# Patient Record
Sex: Female | Born: 1985 | Race: Black or African American | Hispanic: No | Marital: Single | Smoking: Current every day smoker
Health system: Southern US, Community
[De-identification: ages and names within clinical notes are randomized; demographics above are authoritative.]

## PROBLEM LIST (undated history)

## (undated) DIAGNOSIS — K219 Gastro-esophageal reflux disease without esophagitis: Secondary | ICD-10-CM

## (undated) DIAGNOSIS — I10 Essential (primary) hypertension: Secondary | ICD-10-CM

## (undated) DIAGNOSIS — K589 Irritable bowel syndrome without diarrhea: Secondary | ICD-10-CM

## (undated) HISTORY — PX: NO PAST SURGERIES: SHX2092

---
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
## 2006-07-25 ENCOUNTER — Inpatient Hospital Stay (HOSPITAL_COMMUNITY): Admission: AD | Admit: 2006-07-25 | Discharge: 2006-07-25 | Payer: Self-pay | Admitting: Obstetrics & Gynecology

## 2007-02-18 ENCOUNTER — Emergency Department (HOSPITAL_COMMUNITY): Admission: EM | Admit: 2007-02-18 | Discharge: 2007-02-18 | Payer: Self-pay | Admitting: Emergency Medicine

## 2007-03-26 ENCOUNTER — Emergency Department (HOSPITAL_COMMUNITY): Admission: EM | Admit: 2007-03-26 | Discharge: 2007-03-26 | Payer: Self-pay | Admitting: Emergency Medicine

## 2007-07-12 ENCOUNTER — Emergency Department (HOSPITAL_COMMUNITY): Admission: EM | Admit: 2007-07-12 | Discharge: 2007-07-12 | Payer: Self-pay | Admitting: Emergency Medicine

## 2007-12-12 ENCOUNTER — Emergency Department (HOSPITAL_COMMUNITY): Admission: EM | Admit: 2007-12-12 | Discharge: 2007-12-12 | Payer: Self-pay | Admitting: Emergency Medicine

## 2007-12-13 ENCOUNTER — Emergency Department (HOSPITAL_COMMUNITY): Admission: EM | Admit: 2007-12-13 | Discharge: 2007-12-13 | Payer: Self-pay | Admitting: Emergency Medicine

## 2008-03-11 ENCOUNTER — Emergency Department (HOSPITAL_COMMUNITY): Admission: EM | Admit: 2008-03-11 | Discharge: 2008-03-11 | Payer: Self-pay | Admitting: Emergency Medicine

## 2009-01-03 ENCOUNTER — Emergency Department (HOSPITAL_COMMUNITY): Admission: EM | Admit: 2009-01-03 | Discharge: 2009-01-04 | Payer: Self-pay | Admitting: Emergency Medicine

## 2009-12-22 ENCOUNTER — Emergency Department (HOSPITAL_COMMUNITY): Admission: EM | Admit: 2009-12-22 | Discharge: 2009-12-22 | Payer: Self-pay | Admitting: Emergency Medicine

## 2010-12-03 LAB — URINALYSIS, ROUTINE W REFLEX MICROSCOPIC
Glucose, UA: NEGATIVE mg/dL
Nitrite: NEGATIVE
Specific Gravity, Urine: 1.017 (ref 1.005–1.030)
pH: 6 (ref 5.0–8.0)

## 2010-12-03 LAB — PREGNANCY, URINE: Preg Test, Ur: NEGATIVE

## 2010-12-24 LAB — CBC
MCHC: 34.4 g/dL (ref 30.0–36.0)
MCV: 92.2 fL (ref 78.0–100.0)
Platelets: 232 10*3/uL (ref 150–400)
RBC: 4.35 MIL/uL (ref 3.87–5.11)
WBC: 7.3 10*3/uL (ref 4.0–10.5)

## 2010-12-24 LAB — DIFFERENTIAL
Eosinophils Absolute: 0.2 10*3/uL (ref 0.0–0.7)
Eosinophils Relative: 2 % (ref 0–5)
Lymphocytes Relative: 52 % — ABNORMAL HIGH (ref 12–46)
Lymphs Abs: 3.8 10*3/uL (ref 0.7–4.0)
Monocytes Absolute: 0.7 10*3/uL (ref 0.1–1.0)
Monocytes Relative: 10 % (ref 3–12)

## 2010-12-24 LAB — COMPREHENSIVE METABOLIC PANEL
ALT: 43 U/L — ABNORMAL HIGH (ref 0–35)
AST: 34 U/L (ref 0–37)
Albumin: 3.6 g/dL (ref 3.5–5.2)
CO2: 27 mEq/L (ref 19–32)
Calcium: 9 mg/dL (ref 8.4–10.5)
Chloride: 104 mEq/L (ref 96–112)
Creatinine, Ser: 0.57 mg/dL (ref 0.4–1.2)
GFR calc Af Amer: >60 mL/min (ref >60)
GFR calc non Af Amer: >60 mL/min (ref >60)
Sodium: 138 mEq/L (ref 135–145)

## 2010-12-24 LAB — URINALYSIS, ROUTINE W REFLEX MICROSCOPIC
Nitrite: NEGATIVE
Protein, ur: NEGATIVE mg/dL
Specific Gravity, Urine: 1.009 (ref 1.005–1.030)
Urobilinogen, UA: 0.2 mg/dL (ref 0.0–1.0)

## 2010-12-24 LAB — LIPASE, BLOOD: Lipase: 20 U/L (ref 11–59)
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
## 2011-01-30 NOTE — H&P (Signed)
[facility]  Patient:    Amy Bass, Amy Bass                        MRN: [id number] Adm. Date:  05/03/00 Attending:  Jasmine Pang, M.D.                   Psychiatric Admission Assessment  PATIENT IDENTIFICATION:  This is a 25 year old African-American female referred to our unit after several days of treatment in [facility] for an overdose on Tylenol and Benadryl.  HISTORY OF PRESENT ILLNESS:  Patient has had a several-month history of becoming increasingly depressed with insomnia, psychomotor retardation, anhedonia, anergia and difficulty concentrating.  Her mother reports a decrease in her grades from the A[employer]B honor roll during the last part of the school year.  Patient admits to feeling hopeless and her low self-esteem with persistent suicidal ideation.  She overdosed on 40 Tylenol and a large number of Benadryl in an attempt to kill herself.  A major stressor revolves around her being unable to see her best friend of five years anymore.  The best friends family has converted to the [place] witness faith and will no allow her to see anyone who is not of that faith.  PAST PSYCHIATRIC HISTORY:  No previous treatment.  SUBSTANCE ABUSE HISTORY:  Denies.  PAST MEDICAL HISTORY:  Status post recent treatment for ingestion of Tylenol and Benadryl in an overdose attempt.  She ingested approximately 14-35 g of Tylenol and 1000 mg of Benadryl.  Acetaminophen level was 168 ng/ml which is in the toxic range.  She was treated with Mucomyst.  There were no elevation of LFTs.  ALLERGIES:  No known drug allergies.  MEDICATIONS:  Patient takes birth control pills daily.  She is sexually active.  SOCIAL HISTORY:  Patient lives with her mother, father, brother and sister. She is beginning ninth grade this year.  She likes school, though states her grades began to decrease at the end of last year as she got depressed.  There is no history of  physical or sexual abuse.  FAMILY PSYCHIATRIC HISTORY:  Positive for some history of chemical dependence. The rest is unknown at this time.  LEGAL HISTORY:  None.  MENTAL STATUS EXAMINATION:  On admission, patient presented as a reserved, tearful but cooperative African-American female.  She had poor eye contact and was disheveled.  There was psychomotor retardation.  Speech was soft and slow. There was no articulation disorder.  Mood was depressed.  Affect sad and tearful.  There was positive suicidal ideation as per history of present illness thought she states she is not currently suicidal.  There was no homicidal ideation.  There was no self-injurious behavior or aggression. There was no psychosis or perceptual disturbance.  Thought processes are logical and goal directed.  Thought content revealed no predominant theme. Patient was alert and oriented x 4.  Short-term and long-term memory were adequate.  General fund of knowledge were age and education level appropriate. The attention and concentration were marked by distractibility and difficulty focusing.  Insight moderate.  Judgment poor.  ADMISSION DIAGNOSES: Axis I:    Major depression, single episode, severe without psychosis. Axis II:   Deferred. Axis III:  Status post overdose on Tylenol and Benadryl with medical            stabilization. Axis IV:   Severe. Axis V:    GAF 20.  ASSETS AND STRENGTHS:  Patient is  healthy.  Patient has a supportive family. she does well academically.  She is friendly and engaging and verbal.  PROBLEMS:  Mood instability with suicidal ideation.  SHORT-TERM TREATMENT GOAL:  Resolution of suicidal ideation.  LONG-TERM TREATMENT GOAL:  Resolution of mood instability.  INITIAL PLAN OF CARE:  Begin Celexa for depression.  Will begin unit therapeutic groups and activities and family therapy.  ESTIMATED LENGTH OF STAY:  Five to seven days.  CONDITIONS NECESSARY FOR DISCHARGE:  Not  suicidal.  POST HOSPITAL CARE PLAN:  Return home to live with family.  Follow-up therapy and medication management will be at the [facility]. DD:  05/04/00 TD:  05/05/00 Job: [id number]

## 2011-02-19 ENCOUNTER — Emergency Department (HOSPITAL_COMMUNITY)
Admission: EM | Admit: 2011-02-19 | Discharge: 2011-02-19 | Disposition: A | Discharge status: Home / Self Care | Payer: Self-pay | Attending: Emergency Medicine | Admitting: Emergency Medicine

## 2011-02-19 ENCOUNTER — Emergency Department (HOSPITAL_COMMUNITY)
Admission: EM | Admit: 2011-02-19 | Discharge: 2011-02-19 | Discharge status: Against Medical Advice | Payer: Self-pay | Attending: Emergency Medicine | Admitting: Emergency Medicine

## 2011-02-19 DIAGNOSIS — L0231 Cutaneous abscess of buttock: Secondary | ICD-10-CM | POA: Insufficient documentation

## 2011-02-19 DIAGNOSIS — F17200 Nicotine dependence, unspecified, uncomplicated: Secondary | ICD-10-CM | POA: Insufficient documentation

## 2011-02-19 DIAGNOSIS — L03319 Cellulitis of trunk, unspecified: Secondary | ICD-10-CM | POA: Insufficient documentation

## 2011-02-19 DIAGNOSIS — L02219 Cutaneous abscess of trunk, unspecified: Secondary | ICD-10-CM | POA: Insufficient documentation

## 2011-09-18 ENCOUNTER — Encounter: Payer: Self-pay | Admitting: *Deleted

## 2011-09-18 ENCOUNTER — Emergency Department (HOSPITAL_COMMUNITY)
Admission: EM | Admit: 2011-09-18 | Discharge: 2011-09-18 | Disposition: A | Discharge status: Home / Self Care | Payer: Self-pay | Attending: Emergency Medicine | Admitting: Emergency Medicine

## 2011-09-18 DIAGNOSIS — Z79899 Other long term (current) drug therapy: Secondary | ICD-10-CM | POA: Insufficient documentation

## 2011-09-18 DIAGNOSIS — I10 Essential (primary) hypertension: Secondary | ICD-10-CM | POA: Insufficient documentation

## 2011-09-18 DIAGNOSIS — R220 Localized swelling, mass and lump, head: Secondary | ICD-10-CM | POA: Insufficient documentation

## 2011-09-18 DIAGNOSIS — F17200 Nicotine dependence, unspecified, uncomplicated: Secondary | ICD-10-CM | POA: Insufficient documentation

## 2011-09-18 DIAGNOSIS — T7840XA Allergy, unspecified, initial encounter: Secondary | ICD-10-CM | POA: Insufficient documentation

## 2011-09-18 DIAGNOSIS — R21 Rash and other nonspecific skin eruption: Secondary | ICD-10-CM | POA: Insufficient documentation

## 2011-09-18 HISTORY — DX: Essential (primary) hypertension: I10

## 2011-09-18 MED ORDER — DIPHENHYDRAMINE HCL 25 MG PO TABS: 25.0000 mg | ORAL_TABLET | Freq: Four times a day (QID) | ORAL | Status: AC

## 2011-09-18 MED ORDER — DOXYCYCLINE HYCLATE 100 MG PO CAPS: 100.0000 mg | ORAL_CAPSULE | Freq: Two times a day (BID) | ORAL | Status: AC

## 2011-09-18 MED ORDER — DOXYCYCLINE HYCLATE 100 MG PO TABS
100.0000 mg | ORAL_TABLET | Freq: Once | ORAL | Status: AC
  Administered 2011-09-18: 100 mg via ORAL
  Filled 2011-09-18: qty 1

## 2011-09-18 MED ORDER — DIPHENHYDRAMINE HCL 25 MG PO CAPS
50.0000 mg | ORAL_CAPSULE | Freq: Once | ORAL | Status: AC
  Administered 2011-09-18: 50 mg via ORAL
  Filled 2011-09-18: qty 1

## 2011-09-18 NOTE — ED Provider Notes (Signed)
History     CSN: [id number]  Arrival date & time 09/18/11  1256   First MD Initiated Contact with Patient 09/18/11 1425      Chief Complaint  Patient presents with  . Facial Swelling    pt reports "I think something bit me" pt has swelling to left side of face and eye. pt reports pain around lips and cheek.     (Consider location/radiation/quality/duration/timing/severity/associated sxs/prior treatment) HPI  Patient presents to emergency department complaining of acute onset left-sided facial swelling when she woke this morning. Patient states that she has a history of having some mild skin itching/mild allergic reaction if she changes detergents or sleeps in bedding it and washed in different detergents and states that last night she did sleep with a different pillow and at someone else's house. Patient states when she woke this morning she has swelling around her left eye and on her left cheek that she initially attributed to another allergic reaction. patient states the swelling has actually improved throughout the day however as the swelling in her cheek went down she noticed four small red bumps on her cheek. At that time patient states she was concerned that something may have "a bit me in my sleep." Patient states the swelling is improving and denies any throat swelling or difficulty breathing or swallowing. Patient denies any visual changes or drainage from her eye. Patient states she doesn't have any known specific allergies. Patient takes medicine for hypertension by her PCP but no other daily medicines.  Past Medical History  Diagnosis Date  . Hypertension     History reviewed. No pertinent past surgical history.  History reviewed. No pertinent family history.  History  Substance Use Topics  . Smoking status: Current Everyday Smoker    Types: Cigarettes  . Smokeless tobacco: Not on file  . Alcohol Use: Yes    OB History    Grav Para Term Preterm Abortions TAB SAB Ect  Mult Living                  Review of Systems  All other systems reviewed and are negative.    Allergies  Review of patient's allergies indicates no known allergies.  Home Medications  No current outpatient prescriptions on file.  BP 131/88  Pulse 96  Temp(Src) 98.8 F (37.1 C) (Oral)  Resp 18  Wt 160 lb (72.576 kg)  SpO2 100%  Physical Exam  Nursing note and vitals reviewed. Constitutional: She is oriented to person, place, and time. She appears well-developed and well-nourished. No distress.  HENT:  Head: Normocephalic.       Left. Orbital edema but no erythema or drainage from eye. No pain with tenderness to palpation. Soft tissue swelling of left cheek. No crepitus or erythema.  Eyes: Conjunctivae and EOM are normal. Pupils are equal, round, and reactive to light. Right eye exhibits no discharge. Left eye exhibits no discharge.       EOMI without pain. Airway patent. The uvula edema.  Neck: Normal range of motion. Neck supple.  Cardiovascular: Normal rate, regular rhythm, normal heart sounds and intact distal pulses.  Exam reveals no gallop and no friction rub.   No murmur heard. Pulmonary/Chest: Effort normal and breath sounds normal. No respiratory distress. She has no wheezes. She has no rales. She exhibits no tenderness.  Abdominal: Bowel sounds are normal. She exhibits no distension and no mass. There is no tenderness. There is no rebound and no guarding.  Musculoskeletal: Normal range  of motion. She exhibits no edema and no tenderness.  Lymphadenopathy:    She has no cervical adenopathy.  Neurological: She is alert and oriented to person, place, and time.  Skin: Skin is warm and dry. Rash noted. She is not diaphoretic. No erythema.       Or small faintly erythematous papules in and "C." pattern of left cheek without fluctuance or induration.  Psychiatric: She has a normal mood and affect.    ED Course  Procedures (including critical care time)  By mouth  prednisone and doxycycline  Labs Reviewed - No data to display No results found.   1. Allergic reaction       MDM  Question allergic reaction versus insect bite. Patient has no signs or symptoms of anaphylaxis. The signs or symptoms of orbital or septal cellulitis. She is afebrile and in no acute distress. Patient states swelling has improved throughout the day without any intervention. We will prescribe her dose any history for allergic reaction and doxycycline to cover for infection. Patient is agreeable to following up with her primary care physician on Monday for recheck however we spoke in length about changing or worsening of symptoms it should warrent return to emergency department immediately for further evaluation. Patient voices her understanding and is agreeable to plan.        Jenness [place], [place] 09/18/11 1458

## 2011-09-19 ENCOUNTER — Encounter (HOSPITAL_COMMUNITY): Payer: Self-pay

## 2011-09-19 ENCOUNTER — Emergency Department (HOSPITAL_COMMUNITY)
Admission: EM | Admit: 2011-09-19 | Discharge: 2011-09-19 | Disposition: A | Discharge status: Home / Self Care | Payer: Self-pay | Attending: Emergency Medicine | Admitting: Emergency Medicine

## 2011-09-19 DIAGNOSIS — F17200 Nicotine dependence, unspecified, uncomplicated: Secondary | ICD-10-CM | POA: Insufficient documentation

## 2011-09-19 DIAGNOSIS — I10 Essential (primary) hypertension: Secondary | ICD-10-CM | POA: Insufficient documentation

## 2011-09-19 DIAGNOSIS — R21 Rash and other nonspecific skin eruption: Secondary | ICD-10-CM | POA: Insufficient documentation

## 2011-09-19 DIAGNOSIS — H538 Other visual disturbances: Secondary | ICD-10-CM | POA: Insufficient documentation

## 2011-09-19 DIAGNOSIS — H5789 Other specified disorders of eye and adnexa: Secondary | ICD-10-CM | POA: Insufficient documentation

## 2011-09-19 MED ORDER — DIPHENHYDRAMINE HCL 25 MG PO CAPS
50.0000 mg | ORAL_CAPSULE | Freq: Once | ORAL | Status: AC
  Administered 2011-09-19: 50 mg via ORAL
  Filled 2011-09-19 (×2): qty 1

## 2011-09-19 MED ORDER — TETRACAINE HCL 0.5 % OP SOLN
1.0000 [drp] | Freq: Once | OPHTHALMIC | Status: AC
  Administered 2011-09-19: 1 [drp] via OPHTHALMIC
  Filled 2011-09-19: qty 2

## 2011-09-19 NOTE — ED Provider Notes (Signed)
History     CSN: [id number]  Arrival date & time 09/19/11  1323   First MD Initiated Contact with Patient 09/19/11 1533      No chief complaint on file.   (Consider location/radiation/quality/duration/timing/severity/associated sxs/prior treatment) HPI Comments: Patient presents with chief complaint of left eye swelling and rash on face.  Patient was evaluated for the same complaint yesterday and was discharged with doxycycline and Benadryl.  Patient denies double vision, vision loss or pain with extraocular movements, rhinorrhea, fever or, night sweats, chills, eye redness or vesicle type rash. Pt denies difficulty breathing, shortness of breath, or the feeling that her throat is closing  Patient states she has no voice change and she is able to swallow her secretions.    The history is provided by the patient.    Past Medical History  Diagnosis Date  . Hypertension     History reviewed. No pertinent past surgical history.  No family history on file.  History  Substance Use Topics  . Smoking status: Current Everyday Smoker    Types: Cigarettes  . Smokeless tobacco: Not on file  . Alcohol Use: Yes    OB History    Grav Para Term Preterm Abortions TAB SAB Ect Mult Living                  Review of Systems  Constitutional: Negative for fever (temp 99 at ED arrival ), chills and appetite change.  HENT: Negative for hearing loss, ear pain, congestion, facial swelling, rhinorrhea, trouble swallowing, neck pain, neck stiffness and voice change.   Eyes: Positive for discharge. Negative for photophobia, pain, redness, itching and visual disturbance (slight blurred vision of left eye).  Respiratory: Negative for choking, shortness of breath, wheezing and stridor.   Cardiovascular: Negative for chest pain, palpitations and leg swelling.  Gastrointestinal: Negative for abdominal pain.  Genitourinary: Negative for dysuria, urgency and frequency.  Skin: Positive for rash (on left  cheak, right forhead, right hand, and mid back).  Neurological: Negative for dizziness, syncope, weakness, light-headedness, numbness and headaches.  Psychiatric/Behavioral: Negative for confusion.    Allergies  Review of patient's allergies indicates no known allergies.  Home Medications   Current Outpatient Rx  Name Route Sig Dispense Refill  . DIPHENHYDRAMINE HCL 25 MG PO TABS Oral Take 1 tablet (25 mg total) by mouth every 6 (six) hours. 20 tablet 0  . DOXYCYCLINE HYCLATE 100 MG PO CAPS Oral Take 1 capsule (100 mg total) by mouth 2 (two) times daily. 14 capsule 0    BP 118/75  Pulse 100  Temp 99 F (37.2 C)  Resp 18  SpO2 100%  Physical Exam  Constitutional: She is oriented to person, place, and time. She appears well-developed and well-nourished. No distress.  HENT:  Head: Normocephalic and atraumatic.    Right Ear: Hearing and tympanic membrane normal.  Left Ear: Hearing and tympanic membrane normal.  Mouth/Throat: Oropharynx is clear and moist. No oropharyngeal exudate.       Facial nodule with rash in shape of C around the mouth.   Eyes: Conjunctivae and EOM are normal. Pupils are equal, round, and reactive to light. Right conjunctiva is not injected. No scleral icterus.         No visible discharge.  Swelling and erythema surrounding left eye.  EOM intact no nystagmus.  No tenderness to palpation.  Neck: Normal range of motion. Neck supple. No tracheal deviation present. No thyromegaly present.  Cardiovascular: Normal rate, regular rhythm, normal  heart sounds and intact distal pulses.   Pulmonary/Chest: Effort normal and breath sounds normal. No stridor. No respiratory distress.  Musculoskeletal: Normal range of motion. She exhibits no edema and no tenderness.  Neurological: She is alert and oriented to person, place, and time. She has normal reflexes. Coordination normal.  Skin: Skin is warm, dry and intact. Rash noted. No petechiae and no purpura noted. Rash is  papular and nodular. Rash is not pustular, not vesicular and not urticarial. She is not diaphoretic. No erythema. No pallor.       Rash does not follow a dermatomal pattern.  Psychiatric: She has a normal mood and affect. Her behavior is normal.    ED Course  Procedures (including critical care time)  Woods lamp performed: No corneal abrasions or fluorescein uptake.  Labs Reviewed - No data to display No results found.   No diagnosis found.    MDM  Peri orbital cellulitis vs insect bite vs allergic rxn  Pts IOPs are WNL, she has no pain or entrapment w EOMs, and she denies loss or double vision. Pt will be advised to continue to take doxycycline as prescribed & f-u w her PCP Monday AM. Pt has also been advised to f-u with opthalmology if blurred vision does not resolve by Monday. Pt advised to return to ED if pain w EOM, double vision, or vision loss occur.         [place], [place] 09/19/11 1727

## 2011-09-19 NOTE — ED Notes (Signed)
Patient here for further evaluation of facial swelling with raised red areas, took meds w/o relief..itching to same

## 2011-09-19 NOTE — ED Provider Notes (Signed)
Medical screening examination/treatment/procedure(s) were performed by non-physician practitioner and as supervising physician I was immediately available for consultation/collaboration.   Chibuikem Thang L Chanci Ojala, MD 09/19/11 0706 

## 2011-09-20 NOTE — ED Provider Notes (Signed)
Medical screening examination/treatment/procedure(s) were performed by non-physician practitioner and as supervising physician I was immediately available for consultation/collaboration.  Amarylis Rovito P Aquinnah Devin, MD 09/20/11 0020 

## 2016-01-15 ENCOUNTER — Encounter (HOSPITAL_COMMUNITY): Payer: Self-pay | Admitting: Emergency Medicine

## 2016-01-15 ENCOUNTER — Emergency Department (HOSPITAL_COMMUNITY)
Admission: EM | Admit: 2016-01-15 | Discharge: 2016-01-15 | Disposition: A | Discharge status: Home / Self Care | Payer: Self-pay | Attending: Emergency Medicine | Admitting: Emergency Medicine

## 2016-01-15 DIAGNOSIS — L0291 Cutaneous abscess, unspecified: Secondary | ICD-10-CM

## 2016-01-15 DIAGNOSIS — I10 Essential (primary) hypertension: Secondary | ICD-10-CM | POA: Insufficient documentation

## 2016-01-15 DIAGNOSIS — Z791 Long term (current) use of non-steroidal anti-inflammatories (NSAID): Secondary | ICD-10-CM | POA: Insufficient documentation

## 2016-01-15 DIAGNOSIS — F17210 Nicotine dependence, cigarettes, uncomplicated: Secondary | ICD-10-CM | POA: Insufficient documentation

## 2016-01-15 DIAGNOSIS — L0390 Cellulitis, unspecified: Secondary | ICD-10-CM

## 2016-01-15 DIAGNOSIS — Z79891 Long term (current) use of opiate analgesic: Secondary | ICD-10-CM | POA: Insufficient documentation

## 2016-01-15 DIAGNOSIS — L02214 Cutaneous abscess of groin: Secondary | ICD-10-CM | POA: Insufficient documentation

## 2016-01-15 DIAGNOSIS — Z792 Long term (current) use of antibiotics: Secondary | ICD-10-CM | POA: Insufficient documentation

## 2016-01-15 MED ORDER — LIDOCAINE-EPINEPHRINE (PF) 2 %-1:200000 IJ SOLN: 10.0000 mL | Freq: Once | INTRAMUSCULAR | Status: DC

## 2016-01-15 MED ORDER — ACETAMINOPHEN 325 MG PO TABS
650.0000 mg | ORAL_TABLET | Freq: Once | ORAL | Status: AC
  Administered 2016-01-15: 650 mg via ORAL
  Filled 2016-01-15: qty 2

## 2016-01-15 MED ORDER — LIDOCAINE-EPINEPHRINE (PF) 2 %-1:200000 IJ SOLN
20.0000 mL | Freq: Once | INTRAMUSCULAR | Status: AC
  Administered 2016-01-15: 20 mL via INTRADERMAL

## 2016-01-15 MED ORDER — HYDROCODONE-ACETAMINOPHEN 5-325 MG PO TABS: 1.0000 | ORAL_TABLET | ORAL | Status: DC | PRN

## 2016-01-15 MED ORDER — LIDOCAINE-EPINEPHRINE (PF) 2 %-1:200000 IJ SOLN
INTRAMUSCULAR | Status: AC
  Filled 2016-01-15: qty 20

## 2016-01-15 MED ORDER — IBUPROFEN 800 MG PO TABS: 800.0000 mg | ORAL_TABLET | Freq: Three times a day (TID) | ORAL | Status: DC

## 2016-01-15 MED ORDER — LIDOCAINE-EPINEPHRINE 2 %-1:100000 IJ SOLN: 20.0000 mL | Freq: Once | INTRAMUSCULAR | Status: DC

## 2016-01-15 MED ORDER — CEPHALEXIN 500 MG PO CAPS: 500.0000 mg | ORAL_CAPSULE | Freq: Four times a day (QID) | ORAL | Status: DC

## 2016-01-15 MED ORDER — SULFAMETHOXAZOLE-TRIMETHOPRIM 800-160 MG PO TABS: 1.0000 | ORAL_TABLET | Freq: Two times a day (BID) | ORAL | Status: DC

## 2016-01-15 MED ORDER — CEPHALEXIN 500 MG PO CAPS: 500.0000 mg | ORAL_CAPSULE | Freq: Four times a day (QID) | ORAL | Status: AC

## 2016-01-15 MED ORDER — SULFAMETHOXAZOLE-TRIMETHOPRIM 800-160 MG PO TABS: 1.0000 | ORAL_TABLET | Freq: Two times a day (BID) | ORAL | Status: AC

## 2016-01-15 MED ORDER — IBUPROFEN 800 MG PO TABS
800.0000 mg | ORAL_TABLET | Freq: Once | ORAL | Status: AC
  Administered 2016-01-15: 800 mg via ORAL
  Filled 2016-01-15: qty 1

## 2016-01-15 NOTE — ED Provider Notes (Signed)
CSN: [id number]     Arrival date & time 01/15/16  4098 History   First MD Initiated Contact with Patient 01/15/16 1103     Chief Complaint  Patient presents with  . Abscess     (Consider location/radiation/quality/duration/timing/severity/associated sxs/prior Treatment) HPI Amy Bass is a 30 y.o. female with PMH significant for HTN who presents with 4 day history of gradual onset, constant, worsening, painful area on her left upper pubic area.  She has attempted warm compresses without relief.  No medications PTA.  No drainage.  No hx of abscess.  Denies other medical problems or immunocompromise.  Past Medical History  Diagnosis Date  . Hypertension    History reviewed. No pertinent past surgical history. No family history on file. Social History  Substance Use Topics  . Smoking status: Current Every Day Smoker    Types: Cigarettes  . Smokeless tobacco: None  . Alcohol Use: Yes   OB History    No data available     Review of Systems All other systems negative unless otherwise stated in HPI    Allergies  Review of patient's allergies indicates no known allergies.  Home Medications   Prior to Admission medications   Medication Sig Start Date End Date Taking? Authorizing Provider  MedroxyPROGESTERone Acetate 150 MG/ML SUSY every 3 (three) months.  12/17/15  Yes Historical Provider, MD  cephALEXin (KEFLEX) 500 MG capsule Take 1 capsule (500 mg total) by mouth 4 (four) times daily. 01/15/16   Cheri Fowler, PA-C  HYDROcodone-acetaminophen (NORCO/VICODIN) 5-325 MG tablet Take 1 tablet by mouth every 4 (four) hours as needed. 01/15/16   Cheri Fowler, PA-C  ibuprofen (ADVIL,MOTRIN) 800 MG tablet Take 1 tablet (800 mg total) by mouth 3 (three) times daily. 01/15/16   Cheri Fowler, PA-C  sulfamethoxazole-trimethoprim (BACTRIM DS,SEPTRA DS) 800-160 MG tablet Take 1 tablet by mouth 2 (two) times daily. 01/15/16 01/22/16  Selig Wampole, PA-C   BP 159/88 mmHg  Pulse 80  Temp(Src) 97.5 F (36.4  C) (Oral)  Resp 17  Ht  (1.626 m)  Wt 89.812 kg  BMI 33.97 kg/m2  SpO2 100% Physical Exam  Constitutional: She is oriented to person, place, and time. She appears well-developed and well-nourished.  HENT:  Head: Normocephalic and atraumatic.  Right Ear: External ear normal.  Left Ear: External ear normal.  Eyes: Conjunctivae are normal. No scleral icterus.  Neck: No tracheal deviation present.  Cardiovascular: Normal rate, regular rhythm and normal heart sounds.   Pulmonary/Chest: Effort normal and breath sounds normal. No respiratory distress. She has no wheezes. She has no rales.  Abdominal: Soft. Bowel sounds are normal. She exhibits no distension. There is no tenderness. There is no rebound.  Musculoskeletal: Normal range of motion.  Neurological: She is alert and oriented to person, place, and time.  Skin: Skin is warm and dry.     Psychiatric: She has a normal mood and affect. Her behavior is normal.    ED Course  Procedures (including critical care time)  INCISION AND DRAINAGE Performed by: Cheri Fowler Consent: Verbal consent obtained. Risks and benefits: risks, benefits and alternatives were discussed Type: abscess  Body area: left upper pubic region  Anesthesia: local infiltration  Incision was made with a scalpel.  Local anesthetic: xylocaine w/ 2% epinephrine  Anesthetic total: 5 ml  Complexity: complex Blunt dissection to break up loculations  Drainage: purulent  Drainage amount: moderate  Packing material: none  Patient tolerance: Patient tolerated the procedure well with no immediate  complications.    Labs Review Labs Reviewed - No data to display  Imaging Review No results found. I have personally reviewed and evaluated these images and lab results as part of my medical decision-making.   EKG Interpretation None      MDM   Final diagnoses:  Abscess and cellulitis   Patient presents with findings consistent with abscess with  surrounding cellulitis.  No systemic symptoms.  VSS, NAD.  Afebrile.  Abscess with cellulitis in left upper mons pubis.  I&D performed with moderate purulent drainage.  Plan to discharge home with Bactrim and Keflex.  Norco and Ibuprofen for pain.  Follow up with PCP or this department in 2 days for wound check.  Discussed return precautions.  Patient agrees and acknowledges the above plan for discharge.    Cheri FowlerKayla Shereece Wellborn, PA-C 01/15/16 1239  Lorre NickAnthony Allen, MD 01/17/16 [phone]

## 2016-01-15 NOTE — ED Notes (Signed)
EDPA MADE AWARE OF DELAY OF BACK ORDER ON LIDO MEDICATION PER PHARMACY. SPOKE WITH PHARMACY. REORDER FOR MEDICATION

## 2016-01-15 NOTE — Discharge Instructions (Signed)
Your abscess was drained today. There is also surrounding cellulitis, or infection. Please take Bactrim twice daily for the next 7 days. Please take Keflex 4 times daily for the next 7 days as well. You may  take Norco every 4 hours as needed for pain. Please also take ibuprofen 3 times daily for pain. Please follow-up with your primary care doctor or this department in 2 days for wound check. Return to the emergency department if you experience fever, chills, nausea, vomiting, or increased redness and pain to the area.  Abscess An abscess is an infected area that contains a collection of pus and debris.It can occur in almost any part of the body. An abscess is also known as a furuncle or boil. CAUSES  An abscess occurs when tissue gets infected. This can occur from blockage of oil or sweat glands, infection of hair follicles, or a minor injury to the skin. As the body tries to fight the infection, pus collects in the area and creates pressure under the skin. This pressure causes pain. People with weakened immune systems have difficulty fighting infections and get certain abscesses more often.  SYMPTOMS Usually an abscess develops on the skin and becomes a painful mass that is red, warm, and tender. If the abscess forms under the skin, you may feel a moveable soft area under the skin. Some abscesses break open (rupture) on their own, but most will continue to get worse without care. The infection can spread deeper into the body and eventually into the bloodstream, causing you to feel ill.  DIAGNOSIS  Your caregiver will take your medical history and perform a physical exam. A sample of fluid may also be taken from the abscess to determine what is causing your infection. TREATMENT  Your caregiver may prescribe antibiotic medicines to fight the infection. However, taking antibiotics alone usually does not cure an abscess. Your caregiver may need to make a small cut (incision) in the abscess to drain the  pus. In some cases, gauze is packed into the abscess to reduce pain and to continue draining the area. HOME CARE INSTRUCTIONS   Only take over-the-counter or prescription medicines for pain, discomfort, or fever as directed by your caregiver.  If you were prescribed antibiotics, take them as directed. Finish them even if you start to feel better.  If gauze is used, follow your caregiver's directions for changing the gauze.  To avoid spreading the infection:  Keep your draining abscess covered with a bandage.  Wash your hands well.  Do not share personal care items, towels, or whirlpools with others.  Avoid skin contact with others.  Keep your skin and clothes clean around the abscess.  Keep all follow-up appointments as directed by your caregiver. SEEK MEDICAL CARE IF:   You have increased pain, swelling, redness, fluid drainage, or bleeding.  You have muscle aches, chills, or a general ill feeling.  You have a fever. MAKE SURE YOU:   Understand these instructions.  Will watch your condition.  Will get help right away if you are not doing well or get worse.   This information is not intended to replace advice given to you by your health care provider. Make sure you discuss any questions you have with your health care provider.   Document Released: 06/10/2005 Document Revised: 03/01/2012 Document Reviewed: 11/13/2011 Elsevier Interactive Patient Education 2016 Elsevier Inc.  Incision and Drainage Incision and drainage is a procedure in which a sac-like structure (cystic structure) is opened and drained. The  area to be drained usually contains material such as pus, fluid, or blood.  LET YOUR CAREGIVER KNOW ABOUT:   Allergies to medicine.  Medicines taken, including vitamins, herbs, eyedrops, over-the-counter medicines, and creams.  Use of steroids (by mouth or creams).  Previous problems with anesthetics or numbing medicines.  History of bleeding problems or blood  clots.  Previous surgery.  Other health problems, including diabetes and kidney problems.  Possibility of pregnancy, if this applies. RISKS AND COMPLICATIONS  Pain.  Bleeding.  Scarring.  Infection. BEFORE THE PROCEDURE  You may need to have an ultrasound or other imaging tests to see how large or deep your cystic structure is. Blood tests may also be used to determine if you have an infection or how severe the infection is. You may need to have a tetanus shot. PROCEDURE  The affected area is cleaned with a cleaning fluid. The cyst area will then be numbed with a medicine (local anesthetic). A small incision will be made in the cystic structure. A syringe or catheter may be used to drain the contents of the cystic structure, or the contents may be squeezed out. The area will then be flushed with a cleansing solution. After cleansing the area, it is often gently packed with a gauze or another wound dressing. Once it is packed, it will be covered with gauze and tape or some other type of wound dressing. AFTER THE PROCEDURE   Often, you will be allowed to go home right after the procedure.  You may be given antibiotic medicine to prevent or heal an infection.  If the area was packed with gauze or some other wound dressing, you will likely need to come back in 1 to 2 days to get it removed.  The area should heal in about 14 days.   This information is not intended to replace advice given to you by your health care provider. Make sure you discuss any questions you have with your health care provider.   Document Released: 02/24/2001 Document Revised: 03/01/2012 Document Reviewed: 10/26/2011 Elsevier Interactive Patient Education [employer] Elsevier Inc.

## 2016-01-15 NOTE — ED Notes (Signed)
EDPA AND LAUREN M PRESENT at bedside.

## 2016-01-15 NOTE — ED Notes (Signed)
TRAY AT BS

## 2016-01-15 NOTE — ED Notes (Signed)
Patient here from home with complaint of abscess to upper pubic area. Reports ingrown hair follicle that has gotten bigger over 3 days. No drainage.

## 2016-01-15 NOTE — Progress Notes (Signed)
Pt noted in EPIc without pcp nor insurance coverage ED registration completed in EPIC Registration staff states pt did not have her ID number ands was given a form to complete and return Pt states she has insurance [facility] [employer] - [employer] and [employer] shield  Pt [facility] CM noted pt with coverage but no pcp listed [facility] ED CM spoke with pt on how to obtain an in network pcp with insurance coverage via the customer service number or web site  Cm reviewed ED level of care for crisis/emergent services and community pcp level of care to manage continuous or chronic medical concerns.  The pt voiced understanding CM encouraged pt and discussed pt's responsibility to verify with pt's insurance carrier that any recommended medical provider offered by any emergency room or a hospital provider is within the carrier's network. The pt voiced understanding  Cm discussed pt checking [employer]BS for OB GYN & pcp in [place] for f/u care Pt left with uninsured resources also for medication resources on sheet  Pt informed Cm she would like to leave to get to her 3 pm class  ED Rn updated about pt having a 3 pm class to go to

## 2016-01-15 NOTE — ED Notes (Signed)
LIDO MEDICATION AT BEDSIDE

## 2016-04-10 ENCOUNTER — Other Ambulatory Visit: Payer: Self-pay | Admitting: Family

## 2016-04-10 DIAGNOSIS — R1012 Left upper quadrant pain: Secondary | ICD-10-CM

## 2016-04-17 ENCOUNTER — Other Ambulatory Visit: Payer: Self-pay

## 2018-01-08 ENCOUNTER — Other Ambulatory Visit: Payer: Self-pay

## 2018-01-08 ENCOUNTER — Emergency Department (HOSPITAL_COMMUNITY): Payer: Self-pay

## 2018-01-08 ENCOUNTER — Emergency Department (HOSPITAL_COMMUNITY)
Admission: EM | Admit: 2018-01-08 | Discharge: 2018-01-08 | Disposition: A | Discharge status: Home / Self Care | Payer: Self-pay | Attending: Emergency Medicine | Admitting: Emergency Medicine

## 2018-01-08 ENCOUNTER — Encounter (HOSPITAL_COMMUNITY): Payer: Self-pay

## 2018-01-08 DIAGNOSIS — Z720 Tobacco use: Secondary | ICD-10-CM

## 2018-01-08 DIAGNOSIS — F17290 Nicotine dependence, other tobacco product, uncomplicated: Secondary | ICD-10-CM | POA: Insufficient documentation

## 2018-01-08 DIAGNOSIS — I10 Essential (primary) hypertension: Secondary | ICD-10-CM | POA: Insufficient documentation

## 2018-01-08 DIAGNOSIS — J209 Acute bronchitis, unspecified: Secondary | ICD-10-CM | POA: Insufficient documentation

## 2018-01-08 LAB — GROUP A STREP BY PCR: Group A Strep by PCR: NOT DETECTED

## 2018-01-08 MED ORDER — ALBUTEROL SULFATE HFA 108 (90 BASE) MCG/ACT IN AERS
1.0000 | INHALATION_SPRAY | RESPIRATORY_TRACT | Status: DC | PRN
  Administered 2018-01-08: 1 via RESPIRATORY_TRACT
  Filled 2018-01-08: qty 6.7

## 2018-01-08 MED ORDER — IBUPROFEN 800 MG PO TABS
800.0000 mg | ORAL_TABLET | Freq: Once | ORAL | Status: AC
  Administered 2018-01-08: 800 mg via ORAL
  Filled 2018-01-08: qty 1

## 2018-01-08 MED ORDER — AZITHROMYCIN 250 MG PO TABS: 250.0000 mg | ORAL_TABLET | Freq: Every day | ORAL | 0 refills | Status: DC

## 2018-01-08 MED ORDER — AZITHROMYCIN 250 MG PO TABS
500.0000 mg | ORAL_TABLET | Freq: Once | ORAL | Status: AC
  Administered 2018-01-08: 500 mg via ORAL
  Filled 2018-01-08: qty 2

## 2018-01-08 MED ORDER — AEROCHAMBER PLUS FLO-VU MEDIUM MISC
1.0000 | Freq: Once | Status: AC
  Administered 2018-01-08: 1
  Filled 2018-01-08: qty 1

## 2018-01-08 MED ORDER — HYDROCODONE-ACETAMINOPHEN 5-325 MG PO TABS: 1.0000 | ORAL_TABLET | ORAL | 0 refills | Status: DC | PRN

## 2018-01-08 MED ORDER — IBUPROFEN 600 MG PO TABS: 600.0000 mg | ORAL_TABLET | Freq: Four times a day (QID) | ORAL | 0 refills | Status: AC | PRN

## 2018-01-08 NOTE — ED Triage Notes (Signed)
PT C/O A PRODUCTIVE COUGH WITH GREEN SPUTUM, FEVER, AND CHEST CONGESTION SINCE X 3 WEEKS. DENIES N/V.

## 2018-01-08 NOTE — Discharge Instructions (Signed)
Try to stop smoking. °

## 2018-01-08 NOTE — ED Provider Notes (Signed)
[facility]-EMERGENCY DEPT Provider Note   CSN: [id number] Arrival date & time: 01/08/18  8657     History   Chief Complaint Chief Complaint  Patient presents with  . Cough    HPI Amy Bass is a 32 y.o. female.  Pt presents to the ED today with cough and congestion for the past 3 weeks.  Sx started like allergies, but have progressed to coughing up green sputum.  Pt has had fevers.  She has taken otc nyquil without relief.  She does smoke.     Past Medical History:  Diagnosis Date  . Hypertension     There are no active problems to display for this patient.   History reviewed. No pertinent surgical history.   OB History   None      Home Medications    Prior to Admission medications   Medication Sig Start Date End Date Taking? Authorizing Provider  azithromycin (ZITHROMAX Z-PAK) 250 MG tablet Take 1 tablet (250 mg total) by mouth daily. 01/09/18   Jacalyn Lefevre, MD  cephALEXin (KEFLEX) 500 MG capsule Take 1 capsule (500 mg total) by mouth 4 (four) times daily. 01/15/16   Cheri Fowler, PA-C  HYDROcodone-acetaminophen (NORCO/VICODIN) 5-325 MG tablet Take 1 tablet by mouth every 4 (four) hours as needed. 01/08/18   Jacalyn Lefevre, MD  ibuprofen (ADVIL,MOTRIN) 600 MG tablet Take 1 tablet (600 mg total) by mouth every 6 (six) hours as needed. 01/08/18   Jacalyn Lefevre, MD  MedroxyPROGESTERone Acetate 150 MG/ML SUSY every 3 (three) months.  12/17/15   [provider]    Family History History reviewed. No pertinent family history.  Social History Social History   Tobacco Use  . Smoking status: Current Every Day Smoker    Packs/day: 0.25    Types: Cigars  . Smokeless tobacco: Never Used  Substance Use Topics  . Alcohol use: Yes    Comment: OCCASIONAL  . Drug use: No     Allergies   Patient has no known allergies.   Review of Systems Review of Systems  Constitutional: Positive for fever.  Respiratory: Positive for cough  and shortness of breath.   All other systems reviewed and are negative.    Physical Exam Updated Vital Signs BP (!) 157/106 (BP Location: Right Arm)   Pulse 84   Temp 98.4 F (36.9 C) (Oral)   Resp 18   Ht  (1.626 m)   Wt 104.3 kg (230 lb)   SpO2 97%   BMI 39.48 kg/m   Physical Exam  Constitutional: She is oriented to person, place, and time. She appears well-developed and well-nourished.  HENT:  Head: Normocephalic and atraumatic.  Right Ear: External ear normal.  Left Ear: External ear normal.  Nose: Nose normal.  Mouth/Throat: Oropharynx is clear and moist.  Eyes: Pupils are equal, round, and reactive to light. Conjunctivae and EOM are normal.  Neck: Normal range of motion. Neck supple.  Cardiovascular: Normal rate, regular rhythm, normal heart sounds and intact distal pulses.  Pulmonary/Chest: She has wheezes.  Abdominal: Soft. Bowel sounds are normal.  Musculoskeletal: Normal range of motion.  Neurological: She is alert and oriented to person, place, and time.  Skin: Skin is warm. Capillary refill takes less than 2 seconds.  Psychiatric: She has a normal mood and affect. Her behavior is normal. Judgment and thought content normal.  Nursing note and vitals reviewed.    ED Treatments / Results  Labs (all labs ordered are listed, but  only abnormal results are displayed) Labs Reviewed  GROUP A STREP BY PCR    EKG None  Radiology Dg Chest 2 View  Result Date: 01/08/2018 CLINICAL DATA:  Cough for the past 3 weeks. EXAM: CHEST - 2 VIEW COMPARISON:  Chest x-ray dated August 18, 2004. FINDINGS: The heart size and mediastinal contours are within normal limits. Both lungs are clear. The visualized skeletal structures are unremarkable. IMPRESSION: No active cardiopulmonary disease. Electronically Signed   By: Obie Dredge M.D.   On: 01/08/2018 11:28    Procedures Procedures (including critical care time)  Medications Ordered in ED Medications    azithromycin (ZITHROMAX) tablet 500 mg (has no administration in time range)  albuterol (PROVENTIL HFA;VENTOLIN HFA) 108 (90 Base) MCG/ACT inhaler 1-2 puff (has no administration in time range)  AEROCHAMBER PLUS FLO-VU MEDIUM MISC 1 each (has no administration in time range)  ibuprofen (ADVIL,MOTRIN) tablet 800 mg (has no administration in time range)     Initial Impression / Assessment and Plan / ED Course  I have reviewed the triage vital signs and the nursing notes.  Pertinent labs & imaging results that were available during my care of the patient were reviewed by me and considered in my medical decision making (see chart for details).  Pt will be d/c home with zithromax as sx have been going on for 3 weeks with productive green sputum.  She is given and albuterol inhaler and spacer prior to d/c.  She is encouraged to stop smoking.  She knows to return if worse and try to establish primary care.  Final Clinical Impressions(s) / ED Diagnoses   Final diagnoses:  Acute bronchitis, unspecified organism  Tobacco abuse    ED Discharge Orders        Ordered    azithromycin (ZITHROMAX Z-PAK) 250 MG tablet  Daily     01/08/18 1147    ibuprofen (ADVIL,MOTRIN) 600 MG tablet  Every 6 hours PRN     01/08/18 1147    HYDROcodone-acetaminophen (NORCO/VICODIN) 5-325 MG tablet  Every 4 hours PRN     01/08/18 1147       Jacalyn Lefevre, MD 01/08/18 1151

## 2018-05-14 ENCOUNTER — Encounter (HOSPITAL_COMMUNITY): Payer: Self-pay | Admitting: *Deleted

## 2018-05-14 ENCOUNTER — Inpatient Hospital Stay (HOSPITAL_COMMUNITY)
Admission: AD | Admit: 2018-05-14 | Discharge: 2018-05-14 | Disposition: A | Discharge status: Home / Self Care | Payer: Self-pay | Source: Ambulatory Visit | Attending: Obstetrics & Gynecology | Admitting: Obstetrics & Gynecology

## 2018-05-14 DIAGNOSIS — F17290 Nicotine dependence, other tobacco product, uncomplicated: Secondary | ICD-10-CM | POA: Insufficient documentation

## 2018-05-14 DIAGNOSIS — R102 Pelvic and perineal pain: Secondary | ICD-10-CM

## 2018-05-14 DIAGNOSIS — K589 Irritable bowel syndrome without diarrhea: Secondary | ICD-10-CM | POA: Insufficient documentation

## 2018-05-14 DIAGNOSIS — I10 Essential (primary) hypertension: Secondary | ICD-10-CM | POA: Insufficient documentation

## 2018-05-14 DIAGNOSIS — K219 Gastro-esophageal reflux disease without esophagitis: Secondary | ICD-10-CM | POA: Insufficient documentation

## 2018-05-14 HISTORY — DX: Gastro-esophageal reflux disease without esophagitis: K21.9

## 2018-05-14 HISTORY — DX: Irritable bowel syndrome without diarrhea: K58.9

## 2018-05-14 LAB — URINALYSIS, ROUTINE W REFLEX MICROSCOPIC
BACTERIA UA: NONE SEEN
BILIRUBIN URINE: NEGATIVE
GLUCOSE, UA: NEGATIVE mg/dL
Ketones, ur: NEGATIVE mg/dL
NITRITE: NEGATIVE
PROTEIN: NEGATIVE mg/dL
SPECIFIC GRAVITY, URINE: 1.021 (ref 1.005–1.030)
pH: 5 (ref 5.0–8.0)

## 2018-05-14 LAB — WET PREP, GENITAL
Sperm: NONE SEEN
Trich, Wet Prep: NONE SEEN
Yeast Wet Prep HPF POC: NONE SEEN

## 2018-05-14 LAB — POCT PREGNANCY, URINE: PREG TEST UR: NEGATIVE

## 2018-05-14 MED ORDER — KETOROLAC TROMETHAMINE 60 MG/2ML IM SOLN
60.0000 mg | Freq: Once | INTRAMUSCULAR | Status: AC
  Administered 2018-05-14: 60 mg via INTRAMUSCULAR
  Filled 2018-05-14: qty 2

## 2018-05-14 NOTE — Discharge Instructions (Signed)
Irritable Bowel Syndrome, Adult Irritable bowel syndrome (IBS) is not one specific disease. It is a group of symptoms that affects the organs responsible for digestion (gastrointestinal or GI tract). To regulate how your GI tract works, your body sends signals back and forth between your intestines and your brain. If you have IBS, there may be a problem with these signals. As a result, your GI tract does not function normally. Your intestines may become more sensitive and overreact to certain things. This is especially true when you eat certain foods or when you are under stress. There are four types of IBS. These may be determined based on the consistency of your stool:  IBS with diarrhea.  IBS with constipation.  Mixed IBS.  Unsubtyped IBS.  It is important to know which type of IBS you have. Some treatments are more likely to be helpful for certain types of IBS. What are the causes? The exact cause of IBS is not known. What increases the risk? You may have a higher risk of IBS if:  You are a woman.  You are younger than 32 years old.  You have a family history of IBS.  You have mental health problems.  You have had bacterial infection of your GI tract.  What are the signs or symptoms? Symptoms of IBS vary from person to person. The main symptom is abdominal pain or discomfort. Additional symptoms usually include one or more of the following:  Diarrhea, constipation, or both.  Abdominal swelling or bloating.  Feeling full or sick after eating a small or regular-size meal.  Frequent gas.  Mucus in the stool.  A feeling of having more stool left after a bowel movement.  Symptoms tend to come and go. They may be associated with stress, psychiatric conditions, or nothing at all. How is this diagnosed? There is no specific test to diagnose IBS. Your health care provider will make a diagnosis based on a physical exam, medical history, and your symptoms. You may have other  tests to rule out other conditions that may be causing your symptoms. These may include:  Blood tests.  X-rays.  CT scan.  Endoscopy and colonoscopy. This is a test in which your GI tract is viewed with a long, thin, flexible tube.  How is this treated? There is no cure for IBS, but treatment can help relieve symptoms. IBS treatment often includes:  Changes to your diet, such as: ? Eating more fiber. ? Avoiding foods that cause symptoms. ? Drinking more water. ? Eating regular, medium-sized portioned meals.  Medicines. These may include: ? Fiber supplements if you have constipation. ? Medicine to control diarrhea (antidiarrheal medicines). ? Medicine to help control muscle spasms in your GI tract (antispasmodic medicines). ? Medicines to help with any mental health issues, such as antidepressants or tranquilizers.  Therapy. ? Talk therapy may help with anxiety, depression, or other mental health issues that can make IBS symptoms worse.  Stress reduction. ? Managing your stress can help keep symptoms under control.  Follow these instructions at home:  Take medicines only as directed by your health care provider.  Eat a healthy diet. ? Avoid foods and drinks with added sugar. ? Include more whole grains, fruits, and vegetables gradually into your diet. This may be especially helpful if you have IBS with constipation. ? Avoid any foods and drinks that make your symptoms worse. These may include dairy products and caffeinated or carbonated drinks. ? Do not eat large meals. ? Drink enough   fluid to keep your urine clear or pale yellow.  Exercise regularly. Ask your health care provider for recommendations of good activities for you.  Keep all follow-up visits as directed by your health care provider. This is important. Contact a health care provider if:  You have constant pain.  You have trouble or pain with swallowing.  You have worsening diarrhea. Get help right away  if:  You have severe and worsening abdominal pain.  You have diarrhea and: ? You have a rash, stiff neck, or severe headache. ? You are irritable, sleepy, or difficult to awaken. ? You are weak, dizzy, or extremely thirsty.  You have bright red blood in your stool or you have black tarry stools.  You have unusual abdominal swelling that is painful.  You vomit continuously.  You vomit blood (hematemesis).  You have both abdominal pain and a fever. This information is not intended to replace advice given to you by your health care provider. Make sure you discuss any questions you have with your health care provider. Document Released: 08/31/2005 Document Revised: 01/31/2016 Document Reviewed: 05/18/2014 Elsevier Interactive Patient Education  2018 Elsevier Inc.  

## 2018-05-14 NOTE — MAU Provider Note (Signed)
History     CSN: [id number]  Arrival date and time: 05/14/18 0149   First Provider Initiated Contact with Patient 05/14/18 0242      Chief Complaint  Patient presents with  . Vaginal Pain   Vaginal Pain  The patient's pertinent negatives include no pelvic pain or vaginal discharge. Primary symptoms comment: vaginal pain . This is a new problem. The current episode started today (around 1900 ). The problem occurs constantly. The problem has been unchanged. Pain severity now: 8/10. She is not pregnant. Associated symptoms include diarrhea and nausea (has hx of acid reflux and IBS, and thinks this is related to that. ). Pertinent negatives include no chills, constipation, dysuria, fever, frequency or vomiting. The vaginal discharge was normal. There has been no bleeding. The symptoms are aggravated by bowel movements (coughing, sneezing ). She has tried nothing for the symptoms. She uses progestin injections for contraception. Menstrual history: LMP: none with depo     OB History    Gravida  1   Para      Term      Preterm      AB  1   Living  0     SAB  1   TAB      Ectopic      Multiple      Live Births              Past Medical History:  Diagnosis Date  . GERD (gastroesophageal reflux disease)   . Hypertension   . IBS (irritable bowel syndrome)     Past Surgical History:  Procedure Laterality Date  . NO PAST SURGERIES      History reviewed. No pertinent family history.  Social History   Tobacco Use  . Smoking status: Current Every Day Smoker    Packs/day: 0.25    Types: Cigars  . Smokeless tobacco: Never Used  Substance Use Topics  . Alcohol use: Yes    Comment: OCCASIONAL  . Drug use: No    Allergies: No Known Allergies  Medications Prior to Admission  Medication Sig Dispense Refill Last Dose  . azithromycin (ZITHROMAX Z-PAK) 250 MG tablet Take 1 tablet (250 mg total) by mouth daily. 4 tablet 0   . cephALEXin (KEFLEX) 500 MG capsule Take  1 capsule (500 mg total) by mouth 4 (four) times daily. 28 capsule 0   . HYDROcodone-acetaminophen (NORCO/VICODIN) 5-325 MG tablet Take 1 tablet by mouth every 4 (four) hours as needed. 10 tablet 0   . ibuprofen (ADVIL,MOTRIN) 600 MG tablet Take 1 tablet (600 mg total) by mouth every 6 (six) hours as needed. 30 tablet 0   . MedroxyPROGESTERone Acetate 150 MG/ML SUSY every 3 (three) months.   2 More than a month at Unknown time    Review of Systems  Constitutional: Negative for chills and fever.  Gastrointestinal: Positive for diarrhea and nausea (has hx of acid reflux and IBS, and thinks this is related to that. ). Negative for constipation and vomiting.  Genitourinary: Positive for vaginal pain. Negative for decreased urine volume, dysuria, frequency, pelvic pain, vaginal bleeding and vaginal discharge.   Physical Exam   Blood pressure (!) 158/112, pulse 68, temperature 98.4 F (36.9 C), resp. rate 18, height 5\' 4"  (1.626 m), weight 99.3 kg.  Physical Exam  Nursing note and vitals reviewed. Constitutional: She is oriented to person, place, and time. She appears well-developed and well-nourished. No distress.  HENT:  Head: Normocephalic.  Cardiovascular: Normal rate.  Respiratory:  Effort normal.  GI: Soft. There is no tenderness. There is no rebound.  Genitourinary:  Genitourinary Comments:  External: no lesion Vagina: patient unable to tolerate a speculum. No abnormality noted on digital exam.  Cervix: no CMT Uterus: NSSC Adnexa: NT   Neurological: She is alert and oriented to person, place, and time.  Skin: Skin is warm and dry.  Psychiatric: She has a normal mood and affect.   Results for orders placed or performed during the hospital encounter of 05/14/18 (from the past 24 hour(s))  Urinalysis, Routine w reflex microscopic     Status: Abnormal   Collection Time: 05/14/18  2:32 AM  Result Value Ref Range   Color, Urine YELLOW YELLOW   APPearance CLEAR CLEAR   Specific  Gravity, Urine 1.021 1.005 - 1.030   pH 5.0 5.0 - 8.0   Glucose, UA NEGATIVE NEGATIVE mg/dL   Hgb urine dipstick SMALL (A) NEGATIVE   Bilirubin Urine NEGATIVE NEGATIVE   Ketones, ur NEGATIVE NEGATIVE mg/dL   Protein, ur NEGATIVE NEGATIVE mg/dL   Nitrite NEGATIVE NEGATIVE   Leukocytes, UA TRACE (A) NEGATIVE   RBC / HPF 0-5 0 - 5 RBC/hpf   WBC, UA 0-5 0 - 5 WBC/hpf   Bacteria, UA NONE SEEN NONE SEEN   Squamous Epithelial / LPF 0-5 0 - 5   Mucus PRESENT   Pregnancy, urine POC     Status: None   Collection Time: 05/14/18  2:37 AM  Result Value Ref Range   Preg Test, Ur NEGATIVE NEGATIVE  Wet prep, genital     Status: Abnormal   Collection Time: 05/14/18  2:54 AM  Result Value Ref Range   Yeast Wet Prep HPF POC NONE SEEN NONE SEEN   Trich, Wet Prep NONE SEEN NONE SEEN   Clue Cells Wet Prep HPF POC PRESENT (A) NONE SEEN   WBC, Wet Prep HPF POC FEW (A) NONE SEEN   Sperm NONE SEEN     MAU Course  Procedures  MDM Patient given toradol for pain. Wet prep neg gc/ct pending. Patient advised to follow up with PCP or GI doctor as pain is likely referred pain from her IBS.   Assessment and Plan   1. Vaginal pain    DC home Comfort measures reviewed  RX: none  Return to MAU as needed   Follow-up Information    your PCP or GI doctor Follow up.            Thressa ShellerHeather Nikiyah Fackler 05/14/2018, 2:43 AM

## 2018-05-14 NOTE — MAU Note (Signed)
Hx IBS and reflux. Had BM about 1900 and afterward has had sharp, stabbing pain in vagina that will not go away. Hurts to walk, cough, etc. Denies vag bleeding or d/c

## 2018-05-14 NOTE — Progress Notes (Signed)
Written and verbal d/c instructions given and understanding voiced. 

## 2018-05-17 LAB — GC/CHLAMYDIA PROBE AMP (~~LOC~~) NOT AT ARMC
Chlamydia: NEGATIVE
Neisseria Gonorrhea: NEGATIVE

## 2019-11-12 ENCOUNTER — Emergency Department (HOSPITAL_COMMUNITY): Payer: Self-pay

## 2019-11-12 ENCOUNTER — Emergency Department (HOSPITAL_COMMUNITY)
Admission: EM | Admit: 2019-11-12 | Discharge: 2019-11-12 | Disposition: A | Discharge status: Home / Self Care | Payer: Self-pay | Attending: Emergency Medicine | Admitting: Emergency Medicine

## 2019-11-12 ENCOUNTER — Other Ambulatory Visit: Payer: Self-pay

## 2019-11-12 ENCOUNTER — Encounter (HOSPITAL_COMMUNITY): Payer: Self-pay | Admitting: *Deleted

## 2019-11-12 DIAGNOSIS — I10 Essential (primary) hypertension: Secondary | ICD-10-CM | POA: Insufficient documentation

## 2019-11-12 DIAGNOSIS — R1011 Right upper quadrant pain: Secondary | ICD-10-CM

## 2019-11-12 DIAGNOSIS — F17290 Nicotine dependence, other tobacco product, uncomplicated: Secondary | ICD-10-CM | POA: Insufficient documentation

## 2019-11-12 DIAGNOSIS — K580 Irritable bowel syndrome with diarrhea: Secondary | ICD-10-CM | POA: Insufficient documentation

## 2019-11-12 DIAGNOSIS — N739 Female pelvic inflammatory disease, unspecified: Secondary | ICD-10-CM | POA: Insufficient documentation

## 2019-11-12 LAB — URINALYSIS, ROUTINE W REFLEX MICROSCOPIC
Bilirubin Urine: NEGATIVE
Glucose, UA: NEGATIVE mg/dL
Ketones, ur: 80 mg/dL — AB
Nitrite: NEGATIVE
Protein, ur: NEGATIVE mg/dL
Specific Gravity, Urine: 1.021 (ref 1.005–1.030)
pH: 5 (ref 5.0–8.0)

## 2019-11-12 LAB — COMPREHENSIVE METABOLIC PANEL
ALT: 14 U/L (ref 0–44)
AST: 15 U/L (ref 15–41)
Albumin: 4.1 g/dL (ref 3.5–5.0)
Alkaline Phosphatase: 75 U/L (ref 38–126)
Anion gap: 11 (ref 5–15)
BUN: 9 mg/dL (ref 6–20)
CO2: 24 mmol/L (ref 22–32)
Calcium: 9.3 mg/dL (ref 8.9–10.3)
Chloride: 101 mmol/L (ref 98–111)
Creatinine, Ser: 0.59 mg/dL (ref 0.44–1.00)
GFR calc Af Amer: >60 mL/min (ref >60)
GFR calc non Af Amer: >60 mL/min (ref >60)
Glucose, Bld: 88 mg/dL (ref 70–99)
Potassium: 3.5 mmol/L (ref 3.5–5.1)
Sodium: 136 mmol/L (ref 135–145)
Total Bilirubin: 1.3 mg/dL — ABNORMAL HIGH (ref 0.3–1.2)
Total Protein: 7.9 g/dL (ref 6.5–8.1)

## 2019-11-12 LAB — HCG, SERUM, QUALITATIVE: Preg, Serum: NEGATIVE

## 2019-11-12 LAB — WET PREP, GENITAL
Clue Cells Wet Prep HPF POC: NONE SEEN
Trich, Wet Prep: NONE SEEN
Yeast Wet Prep HPF POC: NONE SEEN

## 2019-11-12 LAB — CBC
HCT: 42.5 % (ref 36.0–46.0)
Hemoglobin: 14.8 g/dL (ref 12.0–15.0)
MCH: 31.8 pg (ref 26.0–34.0)
MCHC: 34.8 g/dL (ref 30.0–36.0)
MCV: 91.2 fL (ref 80.0–100.0)
Platelets: 321 10*3/uL (ref 150–400)
RBC: 4.66 MIL/uL (ref 3.87–5.11)
RDW: 12.3 % (ref 11.5–15.5)
WBC: 12.6 10*3/uL — ABNORMAL HIGH (ref 4.0–10.5)
nRBC: 0 % (ref 0.0–0.2)

## 2019-11-12 LAB — LIPASE, BLOOD: Lipase: 17 U/L (ref 11–51)

## 2019-11-12 MED ORDER — CEFTRIAXONE SODIUM 1 G IJ SOLR
500.0000 mg | Freq: Once | INTRAMUSCULAR | Status: AC
  Administered 2019-11-12: 18:00:00 500 mg via INTRAMUSCULAR
  Filled 2019-11-12: qty 10

## 2019-11-12 MED ORDER — IOHEXOL 300 MG/ML  SOLN
100.0000 mL | Freq: Once | INTRAMUSCULAR | Status: AC | PRN
  Administered 2019-11-12: 17:00:00 100 mL via INTRAVENOUS

## 2019-11-12 MED ORDER — MORPHINE SULFATE (PF) 4 MG/ML IV SOLN
4.0000 mg | Freq: Once | INTRAVENOUS | Status: AC
  Administered 2019-11-12: 13:00:00 4 mg via INTRAVENOUS
  Filled 2019-11-12: qty 1

## 2019-11-12 MED ORDER — STERILE WATER FOR INJECTION IJ SOLN
INTRAMUSCULAR | Status: AC
  Filled 2019-11-12: qty 10

## 2019-11-12 MED ORDER — SODIUM CHLORIDE 0.9% FLUSH
3.0000 mL | Freq: Once | INTRAVENOUS | Status: AC
  Administered 2019-11-12: 13:00:00 3 mL via INTRAVENOUS

## 2019-11-12 MED ORDER — DOXYCYCLINE HYCLATE 100 MG PO CAPS: 100.0000 mg | ORAL_CAPSULE | Freq: Two times a day (BID) | ORAL | 0 refills | Status: AC

## 2019-11-12 MED ORDER — DOXYCYCLINE HYCLATE 100 MG PO TABS
100.0000 mg | ORAL_TABLET | Freq: Once | ORAL | Status: AC
  Administered 2019-11-12: 100 mg via ORAL
  Filled 2019-11-12: qty 1

## 2019-11-12 NOTE — ED Provider Notes (Signed)
[facility]-EMERGENCY DEPT Provider Note   CSN: [id number] Arrival date & time: 11/12/19  1214     History Chief Complaint  Patient presents with  . Abdominal Pain    Amy Bass is a 34 y.o. female with a past medical history significant for GERD, hypertension, and irritable bowel syndrome who presents to the ED due to gradual onset, sharp, 9/10, stabbing abdominal pain x 2 weeks. Pain started in RLQ and has migrated up to RUQ over the past 2 days. Abdominal pain is associated with nausea and non-bloody, non-bilious emesis the first day it started 2 weeks ago. RUQ pain worse after meals. She also admits to constant belching to relieve pressure in her upper abdomen. Patient notes the abdominal pain started with her menstrual cycle which she attributed to menstrual cramps, but become more constant over the past few days. She admits to inability to pass bowel movements and flatulence. Last BM 2/26. Denies vaginal and urinary symptoms. She is currently sexually active with 1 partner with protection. No concern for STDs at this time. No previous abdominal operations. Denies fever and chills.   History obtained from patient and past medical records. No interpreter used during encounter.   Past Medical History:  Diagnosis Date  . GERD (gastroesophageal reflux disease)   . Hypertension   . IBS (irritable bowel syndrome)     There are no problems to display for this patient.   Past Surgical History:  Procedure Laterality Date  . NO PAST SURGERIES       OB History    Gravida  1   Para      Term      Preterm      AB  1   Living  0     SAB  1   TAB      Ectopic      Multiple      Live Births              No family history on file.  Social History   Tobacco Use  . Smoking status: Current Every Day Smoker    Packs/day: 0.25    Types: Cigars  . Smokeless tobacco: Never Used  Substance Use Topics  . Alcohol use: Yes    Comment:  OCCASIONAL  . Drug use: No    Home Medications Prior to Admission medications   Medication Sig Start Date End Date Taking? Authorizing Provider  cephALEXin (KEFLEX) 500 MG capsule Take 1 capsule (500 mg total) by mouth 4 (four) times daily. Patient not taking: Reported on 11/12/2019 01/15/16   Cheri Fowler, PA-C  doxycycline (VIBRAMYCIN) 100 MG capsule Take 1 capsule (100 mg total) by mouth 2 (two) times daily for 14 days. 11/12/19 11/26/19  Mannie Stabile, PA-C  ibuprofen (ADVIL,MOTRIN) 600 MG tablet Take 1 tablet (600 mg total) by mouth every 6 (six) hours as needed. Patient not taking: Reported on 11/12/2019 01/08/18   Jacalyn Lefevre, MD    Allergies    Shellfish allergy  Review of Systems   Review of Systems  Constitutional: Negative for chills and fever.  Respiratory: Negative for shortness of breath.   Cardiovascular: Negative for chest pain.  Gastrointestinal: Positive for abdominal pain, diarrhea (chronic due to IBS), nausea and vomiting.  Genitourinary: Negative for dysuria and vaginal discharge.  Musculoskeletal: Negative for back pain.  All other systems reviewed and are negative.   Physical Exam Updated Vital Signs BP (!) 160/94 (BP Location: Right Arm)  Pulse (!) 57   Temp 98.4 F (36.9 C) (Oral)   Resp 16   Ht 5\' 3"  (1.6 m)   Wt 81.6 kg   LMP 10/29/2019   SpO2 100%   BMI 31.89 kg/m   Physical Exam Vitals and nursing note reviewed. Exam conducted with a chaperone present.  Constitutional:      General: She is not in acute distress.    Appearance: She is not toxic-appearing.  HENT:     Head: Normocephalic.  Eyes:     Pupils: Pupils are equal, round, and reactive to light.  Cardiovascular:     Rate and Rhythm: Normal rate and regular rhythm.     Pulses: Normal pulses.     Heart sounds: Normal heart sounds. No murmur. No friction rub. No gallop.   Pulmonary:     Effort: Pulmonary effort is normal.     Breath sounds: Normal breath sounds.  Abdominal:      General: Abdomen is flat. Bowel sounds are normal. There is no distension.     Palpations: Abdomen is soft.     Tenderness: There is abdominal tenderness. There is guarding.     Comments: Tenderness to palpation in RUQ and RLQ. Positive murphy's sign. Tenderness at McBurney's point. Voluntary guarding.   Genitourinary:    Exam position: Supine.     Vagina: Vaginal discharge and tenderness present.     Comments: Green discharge in vaginal vault and coming from cervix. Positive CMT. Right adnexal tenderness. No mass appreciated.  Musculoskeletal:     Cervical back: Neck supple.     Comments: Able to move all 4 extremities without difficulty.  No lower extremity edema.  Skin:    General: Skin is warm and dry.  Neurological:     General: No focal deficit present.     Mental Status: She is alert.  Psychiatric:        Mood and Affect: Mood normal.        Behavior: Behavior normal.     ED Results / Procedures / Treatments   Labs (all labs ordered are listed, but only abnormal results are displayed) Labs Reviewed  WET PREP, GENITAL - Abnormal; Notable for the following components:      Result Value   WBC, Wet Prep HPF POC MANY (*)    All other components within normal limits  COMPREHENSIVE METABOLIC PANEL - Abnormal; Notable for the following components:   Total Bilirubin 1.3 (*)    All other components within normal limits  CBC - Abnormal; Notable for the following components:   WBC 12.6 (*)    All other components within normal limits  URINALYSIS, ROUTINE W REFLEX MICROSCOPIC - Abnormal; Notable for the following components:   Hgb urine dipstick SMALL (*)    Ketones, ur 80 (*)    Leukocytes,Ua MODERATE (*)    Bacteria, UA RARE (*)    All other components within normal limits  LIPASE, BLOOD  HCG, SERUM, QUALITATIVE  I-STAT BETA HCG BLOOD, ED ([facility], [facility], [facility] ONLY)  GC/CHLAMYDIA PROBE AMP ([place]) NOT AT [facility]    EKG None  Radiology [facility] Transvaginal Non-OB  Result  Date: 11/12/2019 CLINICAL DATA:  34 year old female with abdominal pain. EXAM: TRANSABDOMINAL AND TRANSVAGINAL ULTRASOUND OF PELVIS DOPPLER ULTRASOUND OF OVARIES TECHNIQUE: Both transabdominal and transvaginal ultrasound examinations of the pelvis were performed. Transabdominal technique was performed for global imaging of the pelvis including uterus, ovaries, adnexal regions, and pelvic cul-de-sac. It was necessary to proceed with endovaginal exam  following the transabdominal exam to visualize the endometrium and ovaries. Color and duplex Doppler ultrasound was utilized to evaluate blood flow to the ovaries. COMPARISON:  CT of the abdomen pelvis dated 11/12/2019. FINDINGS: Uterus Measurements: 5.6 x 2.6 x 3.8 cm = volume: 28 mL. No fibroids or other mass visualized. Endometrium Thickness: 3 mm.  No focal abnormality visualized. Right ovary Measurements: 7.0 x 5.4 x 6.7 cm = volume: 131 mL. There is a 3.0 x 3.0 x 3.1 cm cyst in the right ovary. Left ovary Measurements: 5.2 x 3.3 x 4.4 cm = volume: 38 mL. There is a complex cyst with layering debris in the left ovary. Pulsed Doppler evaluation of both ovaries demonstrates normal low-resistance arterial and venous waveforms. Other findings No abnormal free fluid. IMPRESSION: 1. Unremarkable uterus and endometrium. 2. Bilateral ovarian cysts including a complex cyst with layering debris in the left ovary. Clinical correlation is recommended. 3. Doppler detected arterial and venous flow to both ovaries. Electronically Signed   By: Anner Crete M.D.   On: 11/12/2019 19:24   US Pelvis Complete  Result Date: 11/12/2019 CLINICAL DATA:  34 year old female with abdominal pain. EXAM: TRANSABDOMINAL AND TRANSVAGINAL ULTRASOUND OF PELVIS DOPPLER ULTRASOUND OF OVARIES TECHNIQUE: Both transabdominal and transvaginal ultrasound examinations of the pelvis were performed. Transabdominal technique was performed for global imaging of the pelvis including uterus, ovaries,  adnexal regions, and pelvic cul-de-sac. It was necessary to proceed with endovaginal exam following the transabdominal exam to visualize the endometrium and ovaries. Color and duplex Doppler ultrasound was utilized to evaluate blood flow to the ovaries. COMPARISON:  CT of the abdomen pelvis dated 11/12/2019. FINDINGS: Uterus Measurements: 5.6 x 2.6 x 3.8 cm = volume: 28 mL. No fibroids or other mass visualized. Endometrium Thickness: 3 mm.  No focal abnormality visualized. Right ovary Measurements: 7.0 x 5.4 x 6.7 cm = volume: 131 mL. There is a 3.0 x 3.0 x 3.1 cm cyst in the right ovary. Left ovary Measurements: 5.2 x 3.3 x 4.4 cm = volume: 38 mL. There is a complex cyst with layering debris in the left ovary. Pulsed Doppler evaluation of both ovaries demonstrates normal low-resistance arterial and venous waveforms. Other findings No abnormal free fluid. IMPRESSION: 1. Unremarkable uterus and endometrium. 2. Bilateral ovarian cysts including a complex cyst with layering debris in the left ovary. Clinical correlation is recommended. 3. Doppler detected arterial and venous flow to both ovaries. Electronically Signed   By: Anner Crete M.D.   On: 11/12/2019 19:24   CT ABDOMEN PELVIS W CONTRAST  Result Date: 11/12/2019 CLINICAL DATA:  Right-sided pain for 2 weeks. Started inferiorly and now moving superiorly. Clinical constipation. EXAM: CT ABDOMEN AND PELVIS WITH CONTRAST TECHNIQUE: Multidetector CT imaging of the abdomen and pelvis was performed using the standard protocol following bolus administration of intravenous contrast. CONTRAST:  129mL OMNIPAQUE IOHEXOL 300 MG/ML  SOLN COMPARISON:  Abdominal ultrasound of earlier today.  CT 01/04/2009. FINDINGS: Lower chest: Calcified left lower lobe granuloma. Mild centrilobular emphysema. Normal heart size without pericardial or pleural effusion. Hepatobiliary: Hepatomegaly at 20.1 cm craniocaudal. Suspicion of mild hepatic steatosis. Normal gallbladder, without  biliary ductal dilatation. Pancreas: Normal, without mass or ductal dilatation. Spleen: Normal in size, without focal abnormality. Adrenals/Urinary Tract: Normal adrenal glands. Normal kidneys, without hydronephrosis. Normal urinary bladder. Stomach/Bowel: Normal stomach, without wall thickening. Normal colon, appendix, and terminal ileum. Normal small bowel. Vascular/Lymphatic: Markedly age advanced aortic atherosclerosis. No abdominopelvic adenopathy. Reproductive: Normal uterus. Cylindrical areas of central hypoattenuation and peripheral  enhancement, including cephalad to the right ovary on 63/2. This is immediately lateral to the appendix, which appears otherwise normal. There is suggestion of adnexal edema, most significant on the right. Other: Small volume pelvic fluid. Musculoskeletal: No acute osseous abnormality. IMPRESSION: 1. Pelvic findings which are suspicious for pelvic inflammatory disease and hydrosalpinx or pyosalpinx. Correlate with risk factors and consider pelvic ultrasound. 2. Small volume fluid in the cul-de-sac, possibly secondary or physiologic. 3. No other explanation for pain.  Normal appendix. 4. Aortic Atherosclerosis (ICD10-I70.0). This is markedly age advanced. 5.  Emphysema (ICD10-J43.9). 6. Hepatomegaly with possible hepatic steatosis. Electronically Signed   By: Jeronimo Greaves M.D.   On: 11/12/2019 17:10   [place] Art/Ven Flow Abd Pelv Doppler  Result Date: 11/12/2019 CLINICAL DATA:  34 year old female with abdominal pain. EXAM: TRANSABDOMINAL AND TRANSVAGINAL ULTRASOUND OF PELVIS DOPPLER ULTRASOUND OF OVARIES TECHNIQUE: Both transabdominal and transvaginal ultrasound examinations of the pelvis were performed. Transabdominal technique was performed for global imaging of the pelvis including uterus, ovaries, adnexal regions, and pelvic cul-de-sac. It was necessary to proceed with endovaginal exam following the transabdominal exam to visualize the endometrium and ovaries. Color and duplex  Doppler ultrasound was utilized to evaluate blood flow to the ovaries. COMPARISON:  CT of the abdomen pelvis dated 11/12/2019. FINDINGS: Uterus Measurements: 5.6 x 2.6 x 3.8 cm = volume: 28 mL. No fibroids or other mass visualized. Endometrium Thickness: 3 mm.  No focal abnormality visualized. Right ovary Measurements: 7.0 x 5.4 x 6.7 cm = volume: 131 mL. There is a 3.0 x 3.0 x 3.1 cm cyst in the right ovary. Left ovary Measurements: 5.2 x 3.3 x 4.4 cm = volume: 38 mL. There is a complex cyst with layering debris in the left ovary. Pulsed Doppler evaluation of both ovaries demonstrates normal low-resistance arterial and venous waveforms. Other findings No abnormal free fluid. IMPRESSION: 1. Unremarkable uterus and endometrium. 2. Bilateral ovarian cysts including a complex cyst with layering debris in the left ovary. Clinical correlation is recommended. 3. Doppler detected arterial and venous flow to both ovaries. Electronically Signed   By: Elgie Collard M.D.   On: 11/12/2019 19:24   US Abdomen Limited RUQ  Result Date: 11/12/2019 CLINICAL DATA:  Abdominal pain, constant belching EXAM: ULTRASOUND ABDOMEN LIMITED RIGHT UPPER QUADRANT COMPARISON:  CT abdomen and pelvis of 2010 is FINDINGS: Gallbladder: No gallstones or wall thickening visualized. No sonographic Murphy sign noted by sonographer. Common bile duct: Diameter: 3 mm Liver: No focal lesion identified. Within normal limits in parenchymal echogenicity. Portal vein is patent on color Doppler imaging with normal direction of blood flow towards the liver. Other: None. IMPRESSION: 1. Normal right upper quadrant ultrasound Electronically Signed   By: Donzetta Kohut M.D.   On: 11/12/2019 14:02    Procedures Procedures (including critical care time)  Medications Ordered in ED Medications  sterile water (preservative free) injection (has no administration in time range)  sodium chloride flush (NS) 0.9 % injection 3 mL (3 mLs Intravenous Given 11/12/19  1238)  morphine 4 MG/ML injection 4 mg (4 mg Intravenous Given 11/12/19 1319)  iohexol (OMNIPAQUE) 300 MG/ML solution 100 mL (100 mLs Intravenous Contrast Given 11/12/19 1646)  cefTRIAXone (ROCEPHIN) injection 500 mg (500 mg Intramuscular Given 11/12/19 1806)  doxycycline (VIBRA-TABS) tablet 100 mg (100 mg Oral Given 11/12/19 1855)    ED Course  I have reviewed the triage vital signs and the nursing notes.  Pertinent labs & imaging results that were available during my care of the  patient were reviewed by me and considered in my medical decision making (see chart for details).  Clinical Course as of Nov 11 2000  Sun Nov 12, 2019  1354 WBC(!): 12.6 [CA]  1552 Preg, Serum: NEGATIVE [CA]  1718 WBC, Wet Prep HPF POC(!): MANY [CA]  1952 Glori Luis): MODERATE [CA]    Clinical Course User Index [CA] Mannie Stabile, PA-C   MDM Rules/Calculators/A&P                     34 year old female presents to the ED for an evaluation of right upper quadrant and right lower quadrant abdominal pain x2 weeks.  Is a history of IBS but notes this feels different than her normal flares.  Denies previous abdominal operations.  Vitals.  Patient is afebrile, not tachycardic or hypoxic.  Patient no acute distress and nontoxic-appearing.  Abdomen soft, nondistended with tenderness to palpation in the right upper quadrant and right lower quadrant. Suspect gallbladder etiology, will obtain RUQ [place]. Will also obtain CT abdomen/pelvis given tenderness at Mcburney's point to rule out appendicitis. Routine labs ordered. Discussed case with Dr. Manus Gunning who evaluated patient at bedside and agrees with assessment and plan.   CBC significant for leukocytosis at 12.6.  Normal lipase at 17.  Doubt pancreatitis.  CMP reassuring with normal renal function and no electrolyte derangements. RUQ [place] personally reviewed which is negative for any acute findings. CT abdomen/pelvis personally reviewed which demonstrates: 1. Pelvic  findings which are suspicious for pelvic inflammatory  disease and hydrosalpinx or pyosalpinx. Correlate with risk factors  and consider pelvic ultrasound.  2. Small volume fluid in the cul-de-sac, possibly secondary or  physiologic.  3. No other explanation for pain. Normal appendix.  4. Aortic Atherosclerosis (ICD10-I70.0). This is markedly age  advanced.  5. Emphysema (ICD10-J43.9).  6. Hepatomegaly with possible hepatic steatosis.   Pelvic exam performed which was remarkable for significant amount of green discharge and CMT tenderness. Suspect PID. Will obtain US to rule out adnexal mass.  Wet prep positive for white blood cells but negative for clue cells, trichomoniasis, and yeast. [place] personally reviewed which demonstrates: 1. Unremarkable uterus and endometrium.  2. Bilateral ovarian cysts including a complex cyst with layering  debris in the left ovary. Clinical correlation is recommended.  3. Doppler detected arterial and venous flow to both ovaries.   Will discharge patient with PID treatment. Rocephin given here in the ED and first dose of doxycycline. Advised patient to have partners treated. Safe intercourse practices discussed with patient. Strict ED precautions discussed with patient. Patient states understanding and agrees to plan. Patient discharged home in no acute distress and stable vitals.  Final Clinical Impression(s) / ED Diagnoses Final diagnoses:  RUQ pain  Pelvic inflammatory disease    Rx / DC Orders ED Discharge Orders         Ordered    doxycycline (VIBRAMYCIN) 100 MG capsule  2 times daily     11/12/19 1823           Jesusita Oka 11/12/19 2005    Glynn Octave, MD 11/12/19 2359

## 2019-11-12 NOTE — Discharge Instructions (Addendum)
As discussed, your CT scan of your abdomen was concerning for pelvic inflammatory disease.  Your gonorrhea and Chlamydia cultures are pending however you were treated here in the ED for it.  I am also sending home with an antibiotic.  Take twice a day for 14 days.  Your gonorrhea and Chlamydia results will be available within the next few days.  If they are positive, your partners will also need to be treated. Always use a protection during intercourse.  Follow-up with your OB/GYN if symptoms do not improve within the next week.  Return to the ER for new or worsening symptoms.

## 2019-11-12 NOTE — ED Triage Notes (Signed)
Pt states she's been having right abd pain for about two weeks.  Pt states that initially it started in the lower part of her right abdomen but for the past two days, the pain has moved to the upper part of her right abd.  Pt states that she unable to stop burping.  Pt's last BM was Friday (2/26).  Pt states pain is sharp in nature. Pt states she had N/V the first two days the pain started but that has since resolved. No hx of abd surgeries.

## 2019-11-14 LAB — GC/CHLAMYDIA PROBE AMP (~~LOC~~) NOT AT ARMC
Chlamydia: NEGATIVE
Neisseria Gonorrhea: POSITIVE — AB

## 2020-05-08 ENCOUNTER — Other Ambulatory Visit: Payer: Self-pay

## 2020-05-08 DIAGNOSIS — Z20822 Contact with and (suspected) exposure to covid-19: Secondary | ICD-10-CM

## 2020-05-09 LAB — SARS-COV-2, NAA 2 DAY TAT

## 2020-05-09 LAB — NOVEL CORONAVIRUS, NAA: SARS-CoV-2, NAA: NOT DETECTED

## 2021-07-08 IMAGING — US US ABDOMEN LIMITED
1 series · 14 of 25 positions shown · non-contrast
Comparison: CT abdomen and pelvis of 1030 is

CLINICAL DATA: Abdominal pain, constant belching

EXAM:
ULTRASOUND ABDOMEN LIMITED RIGHT UPPER QUADRANT

[Series 1: us abdomen limited · 14 of 48 slices shown]
[im 1/48]
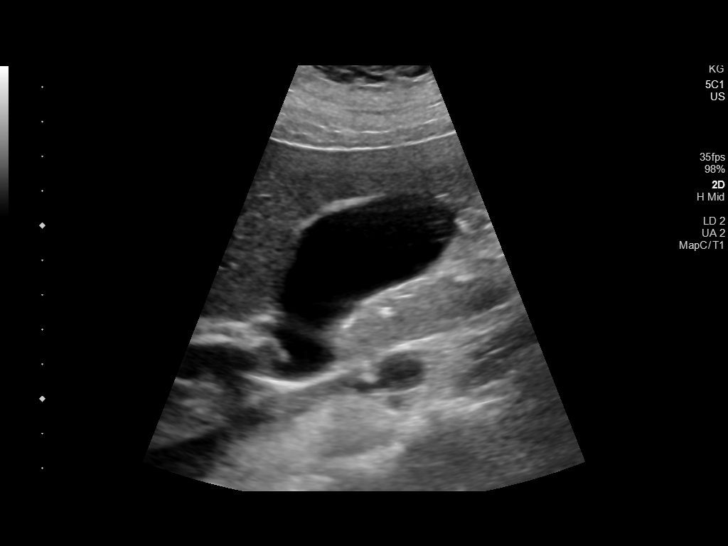
[im 4/48]
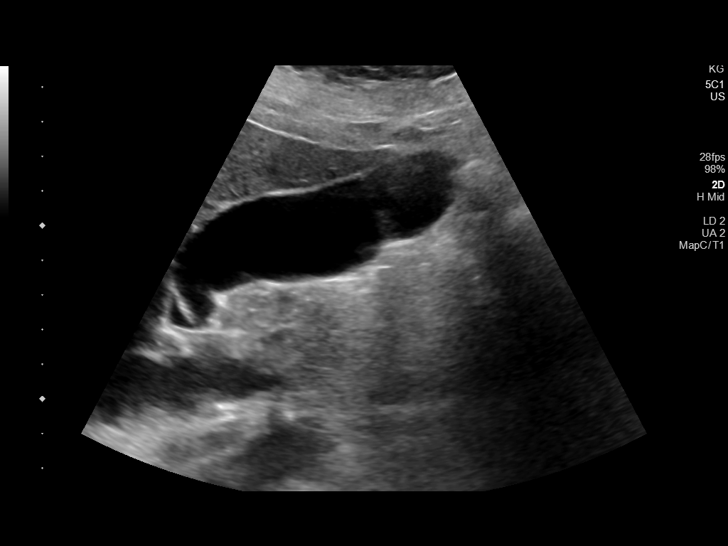
[im 8/48]
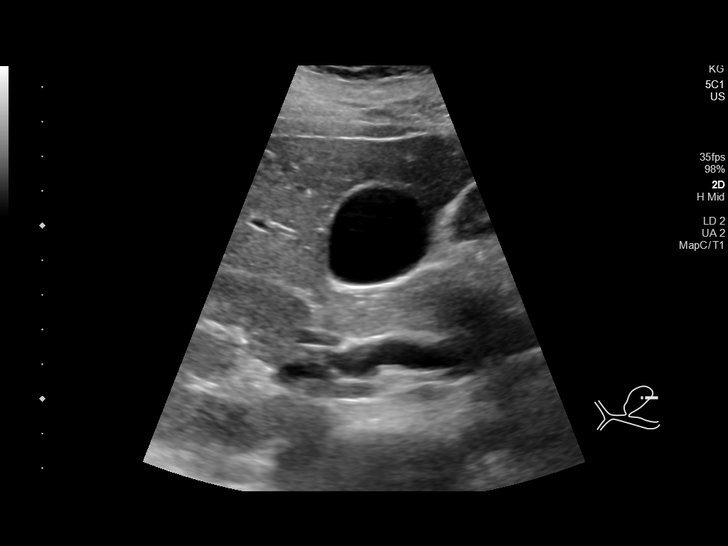
[im 12/48]
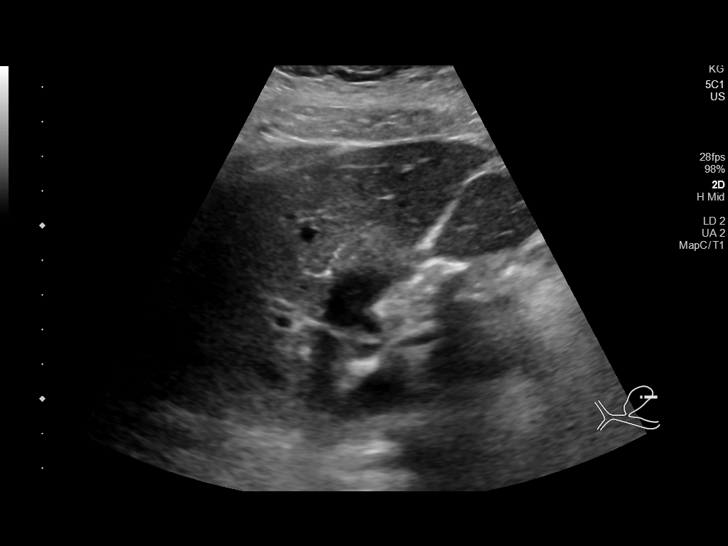
[im 16/48]
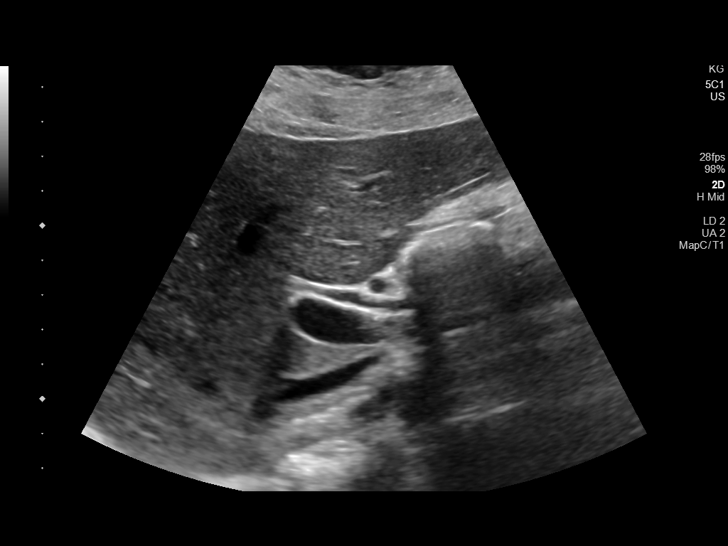
[im 18/48]
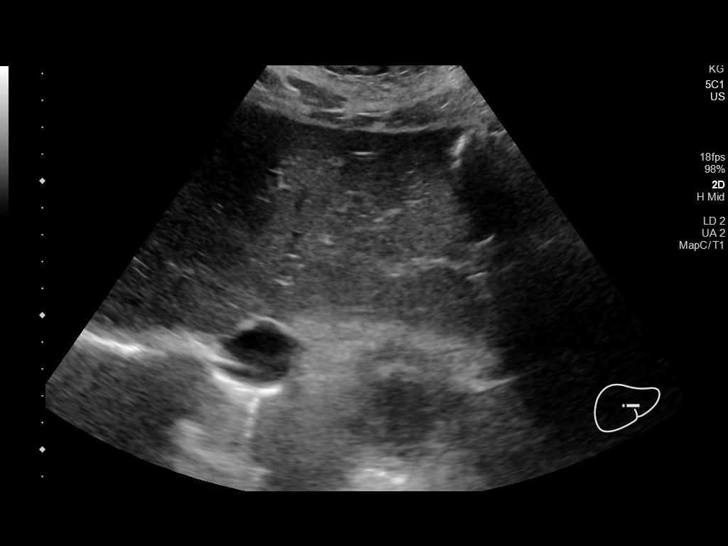
[im 22/48]
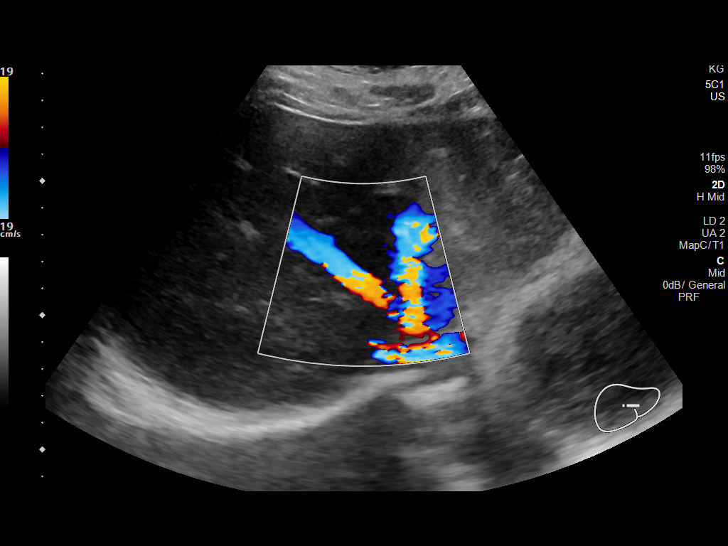
[im 26/48]
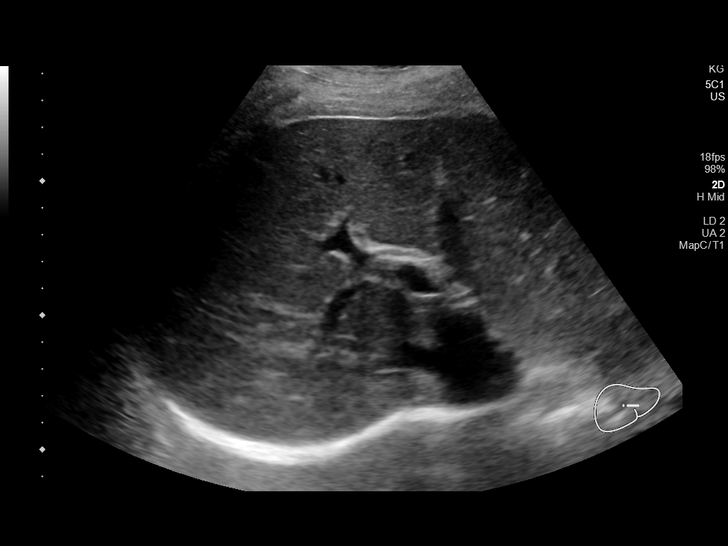
[im 30/48]
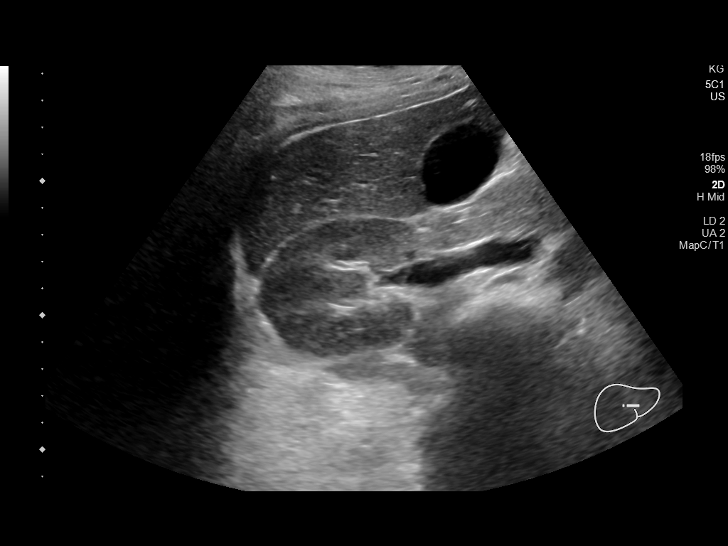
[im 32/48]
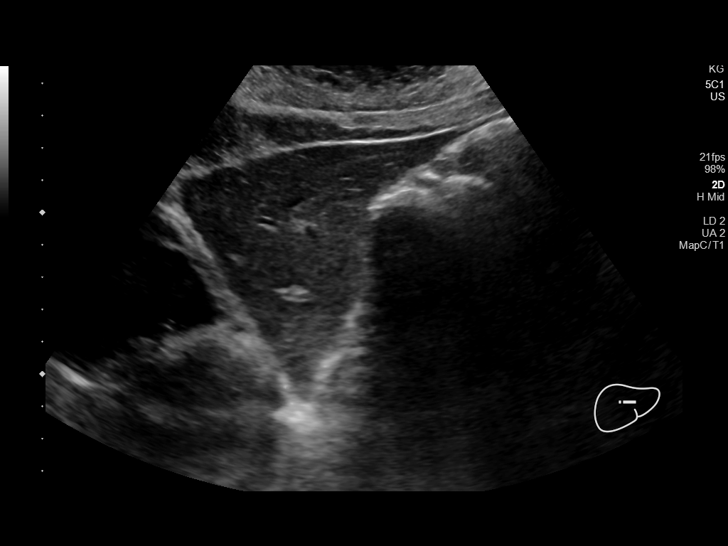
[im 36/48]
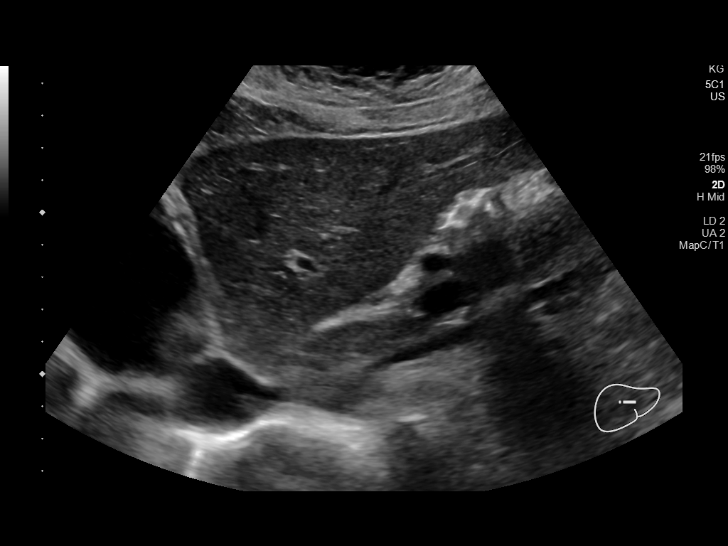
[im 40/48]
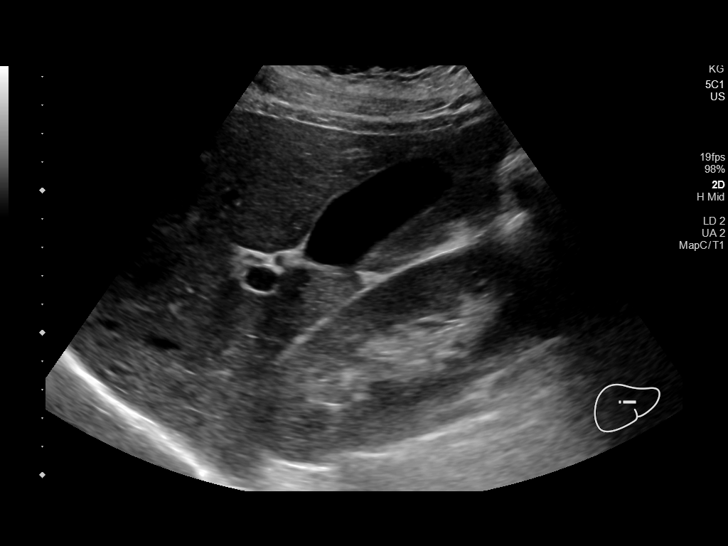
[im 44/48]
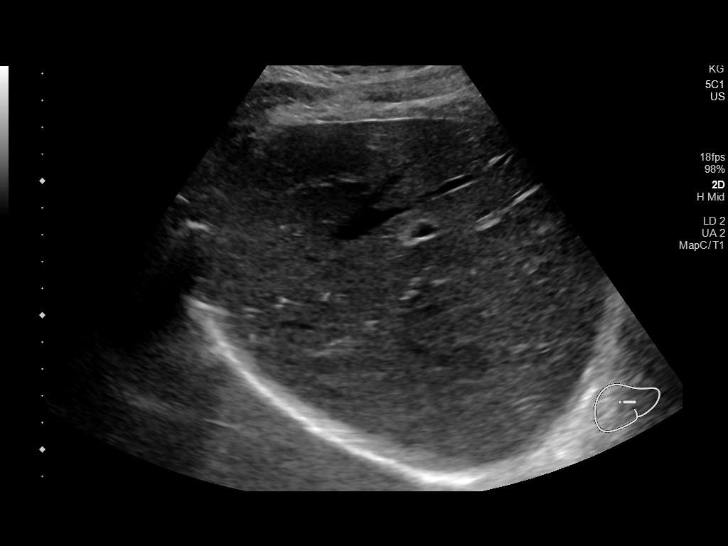
[im 48/48]
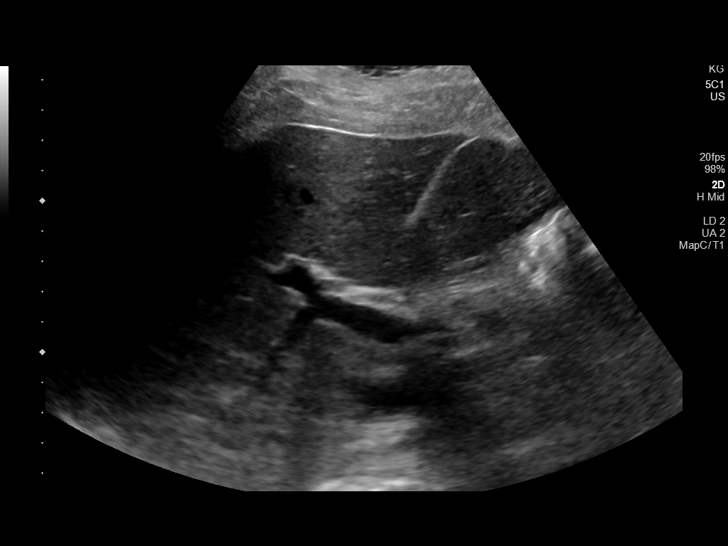

[14 of 25 positions shown; findings below may reference images not displayed]

FINDINGS: Gallbladder:

No gallstones or wall thickening visualized. No sonographic Murphy
sign noted by sonographer.

Common bile duct:

Diameter: 3 mm

Liver:

No focal lesion identified. Within normal limits in parenchymal
echogenicity. Portal vein is patent on color Doppler imaging with
normal direction of blood flow towards the liver.

Other: None.
IMPRESSION: 1. Normal right upper quadrant ultrasound

## 2021-07-08 IMAGING — US US TRANSVAGINAL NON-OB
3 series · 13 of 25 positions shown · non-contrast
Comparison: CT of the abdomen pelvis dated 11/12/2019.

CLINICAL DATA: 33-year-old female with abdominal pain.

EXAM:
TRANSABDOMINAL AND TRANSVAGINAL ULTRASOUND OF PELVIS
DOPPLER ULTRASOUND OF OVARIES
TECHNIQUE: Both transabdominal and transvaginal ultrasound examinations of the
pelvis were performed. Transabdominal technique was performed for
global imaging of the pelvis including uterus, ovaries, adnexal
regions, and pelvic cul-de-sac.
It was necessary to proceed with endovaginal exam following the
transabdominal exam to visualize the endometrium and ovaries. Color
and duplex Doppler ultrasound was utilized to evaluate blood flow to
the ovaries.

[Series 1: us transvaginal non-ob · 3 of 30 slices shown (1 of 3)]
[im 1/30]
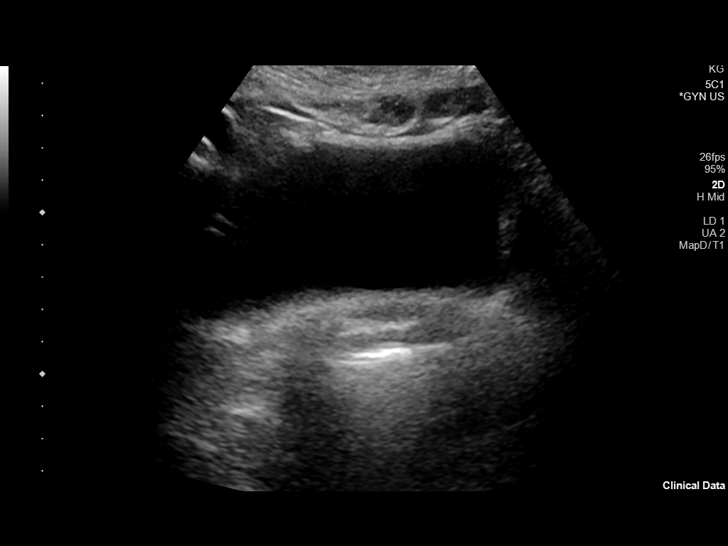
[im 15/30]
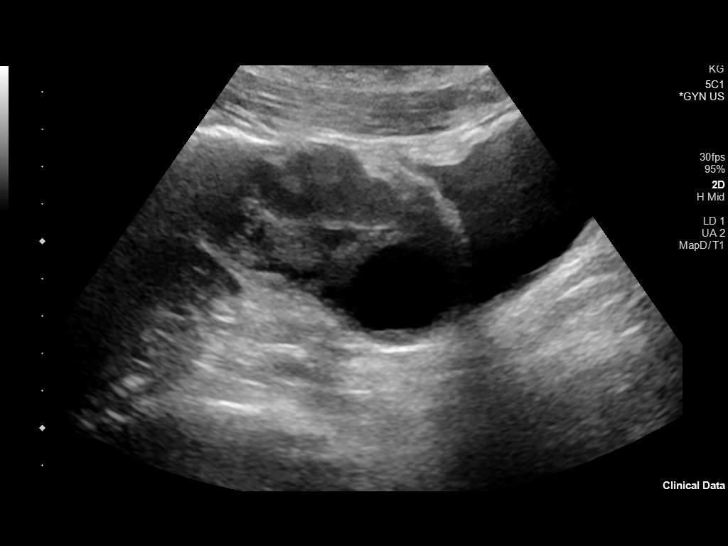
[im 30/30]
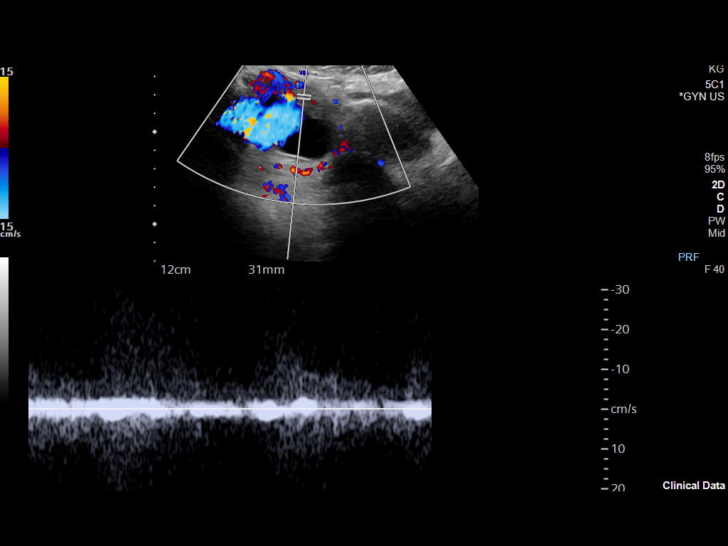

[Series 2: us transvaginal non-ob · 60 acquisitions, 5 frames shown (2 of 3)]
[im 7/60]
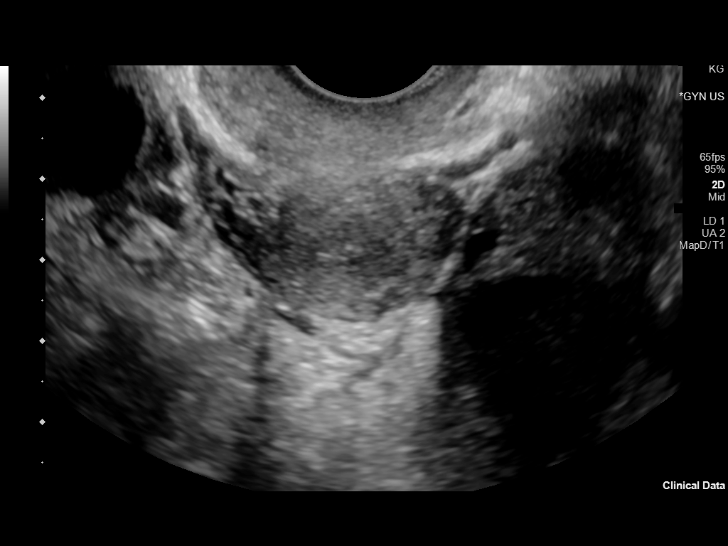
[im 20/60]
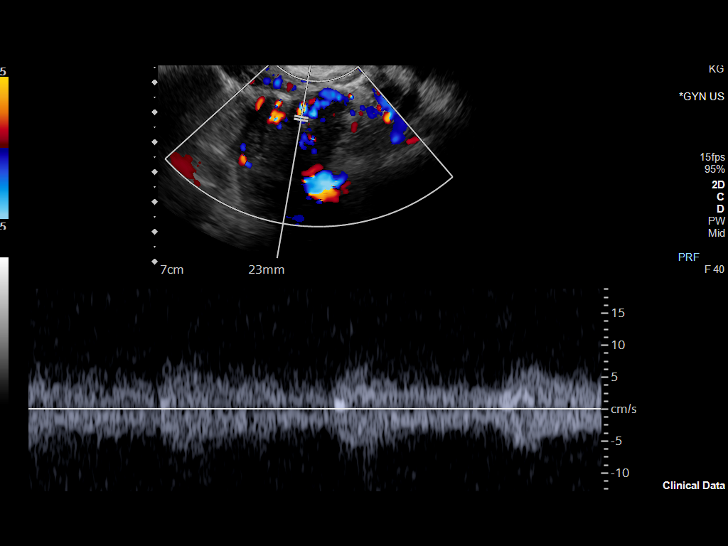
[im 33/60]
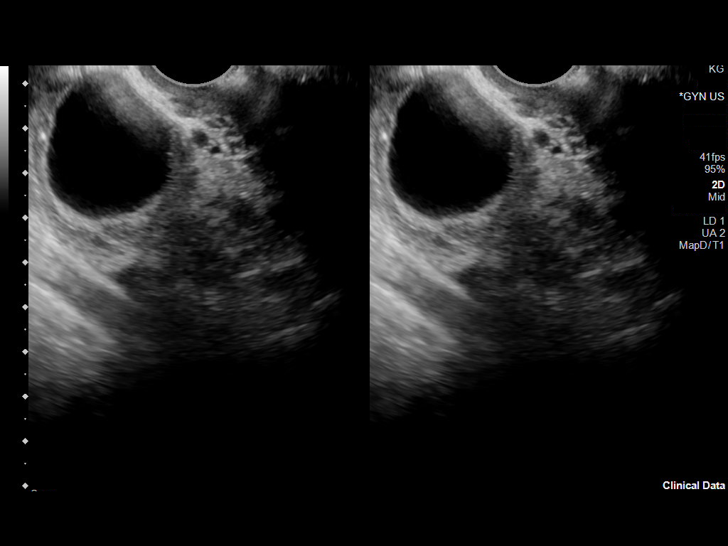
[im 46/60]
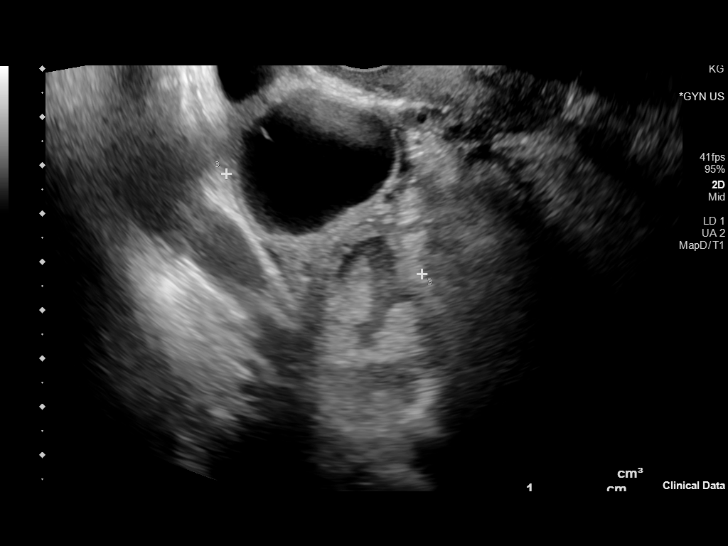
[im 60/60]
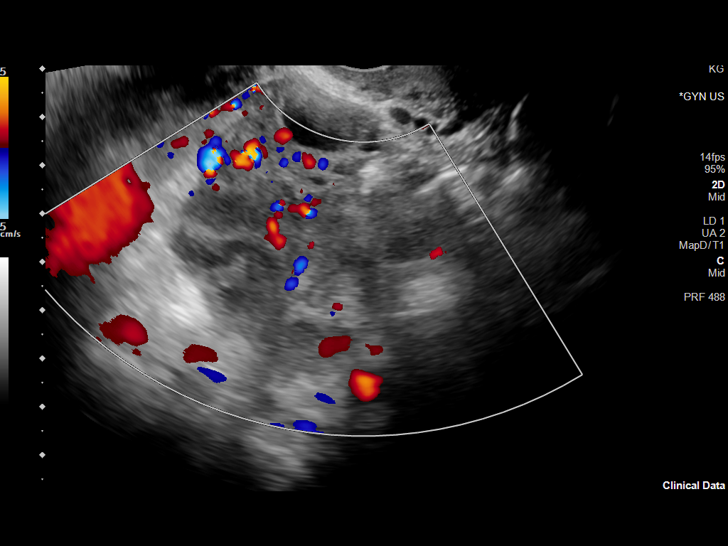

[Series 2: us transvaginal non-ob · 60 acquisitions, 5 frames shown (3 of 3)]
[im 7/60]
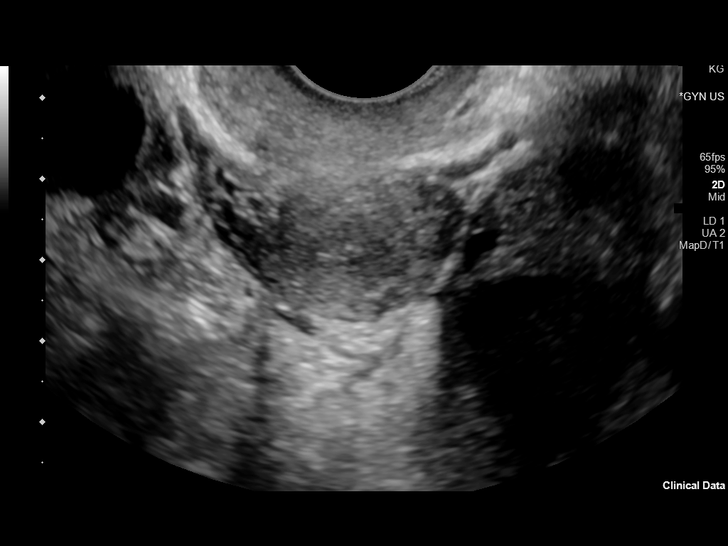
[im 20/60]
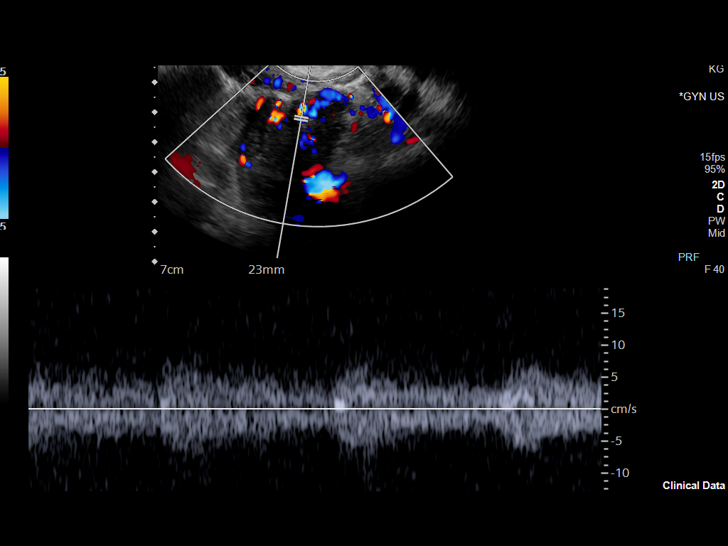
[im 33/60]
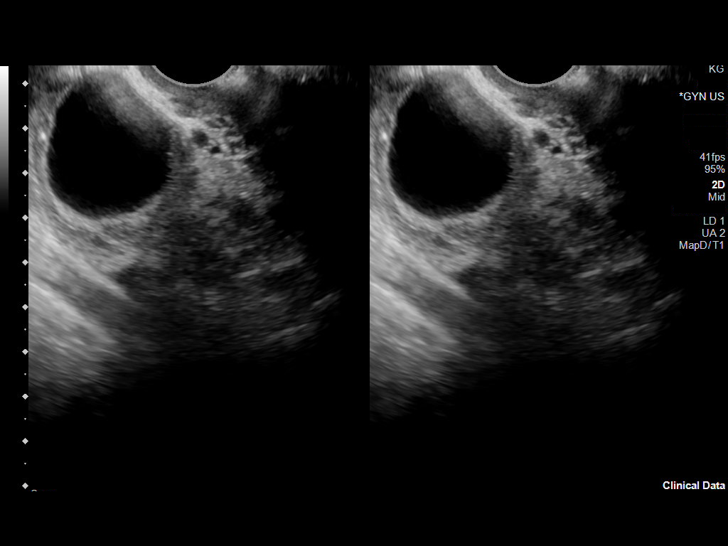
[im 46/60]
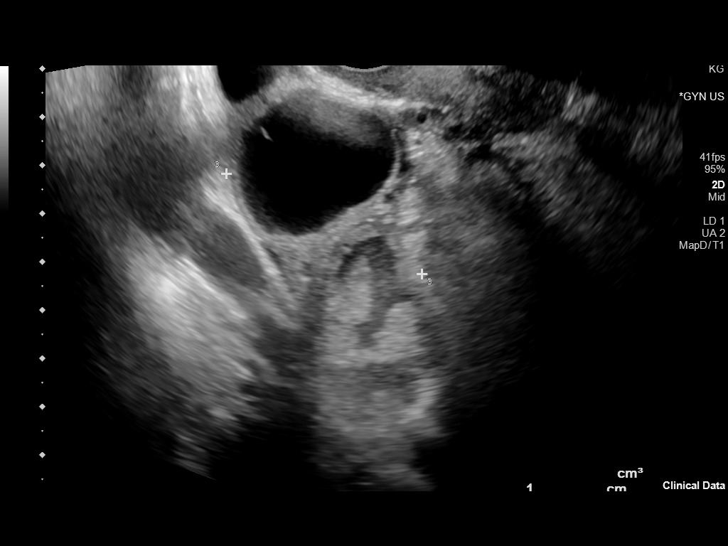
[im 60/60]
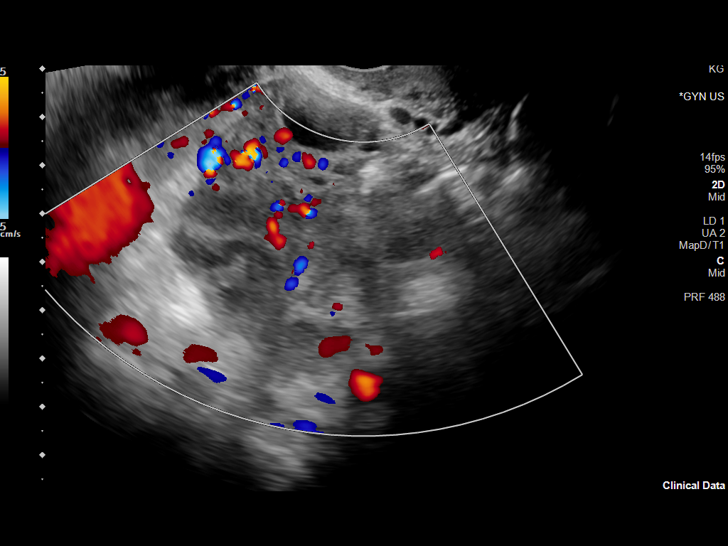

[13 of 25 positions shown; findings below may reference images not displayed]

FINDINGS: Uterus

Measurements: 5.6 x 2.6 x 3.8 cm = volume: 28 mL. No fibroids or
other mass visualized.

Endometrium

Thickness: 3 mm.  No focal abnormality visualized.

Right ovary

Measurements: 7.0 x 5.4 x 6.7 cm = volume: 131 mL. There is a 3.0 x
3.0 x 3.1 cm cyst in the right ovary.

Left ovary

Measurements: 5.2 x 3.3 x 4.4 cm = volume: 38 mL. There is a complex
cyst with layering debris in the left ovary.

Pulsed Doppler evaluation of both ovaries demonstrates normal
low-resistance arterial and venous waveforms.

Other findings

No abnormal free fluid.
IMPRESSION: 1. Unremarkable uterus and endometrium.
2. Bilateral ovarian cysts including a complex cyst with layering
debris in the left ovary. Clinical correlation is recommended.
3. Doppler detected arterial and venous flow to both ovaries.

## 2021-07-08 IMAGING — CT CT ABD-PELV W/ CM
2 of 4 series · 16 of 46 positions shown, 18 images · IV contrast (omnipaque)
Comparison: Abdominal ultrasound of earlier today.  CT 01/04/2009.

CLINICAL DATA: Right-sided pain for 2 weeks. Started inferiorly and
now moving superiorly. Clinical constipation.

EXAM:
CT ABDOMEN AND PELVIS WITH CONTRAST
TECHNIQUE: Multidetector CT imaging of the abdomen and pelvis was performed
using the standard protocol following bolus administration of
intravenous contrast.
CONTRAST:  100mL OMNIPAQUE IOHEXOL 300 MG/ML  SOLN

[Series 2: axial st · axial · 0.72mm/px · z∈[-446,-31]mm · 13 of 95 slices shown, 15 images]
[im 6/95  soft-tissue]
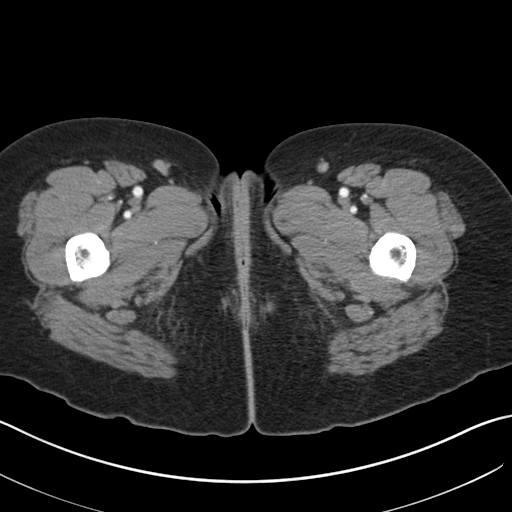
[im 6/95  bone]
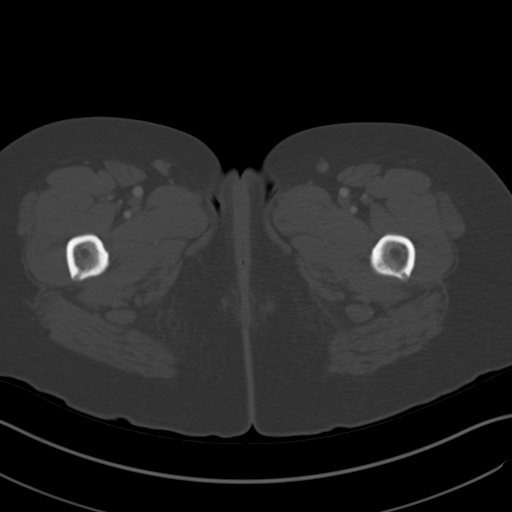
[im 12/95  soft-tissue]
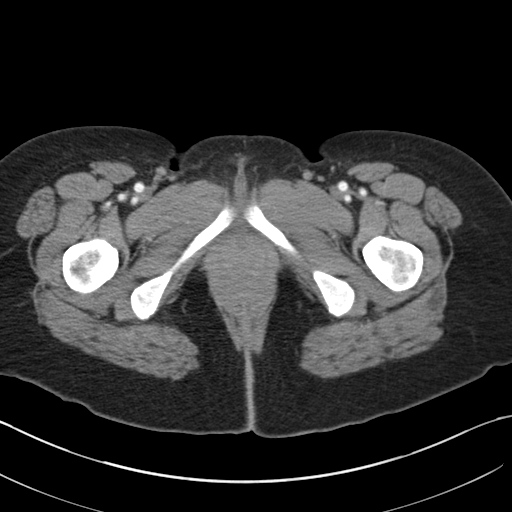
[im 18/95  soft-tissue]
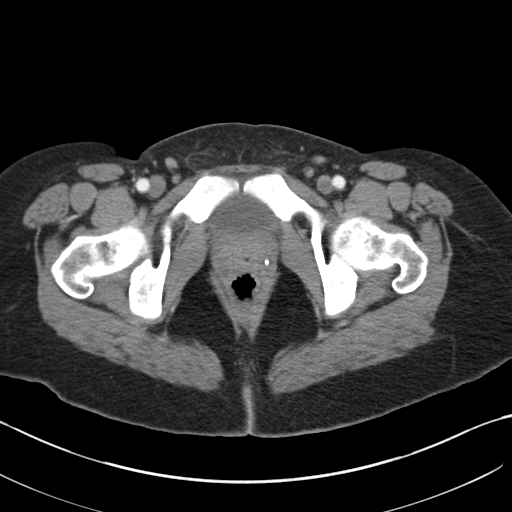
[im 30/95  soft-tissue]
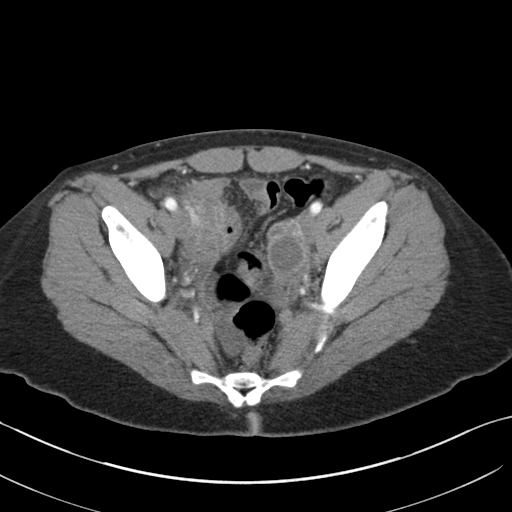
[im 36/95  soft-tissue]
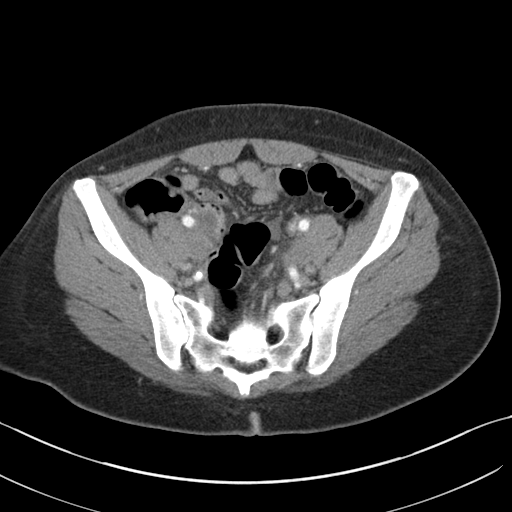
[im 42/95  soft-tissue]
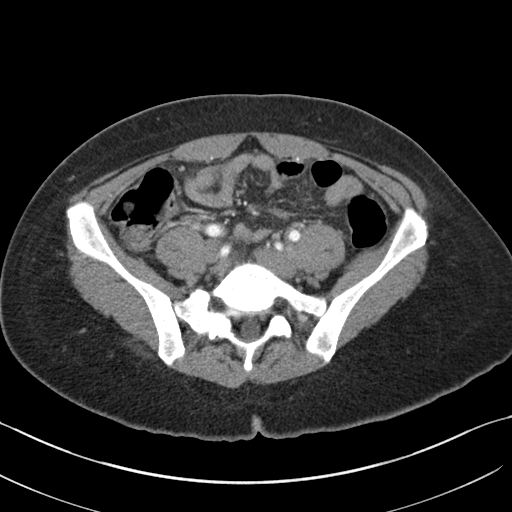
[im 48/95  soft-tissue]
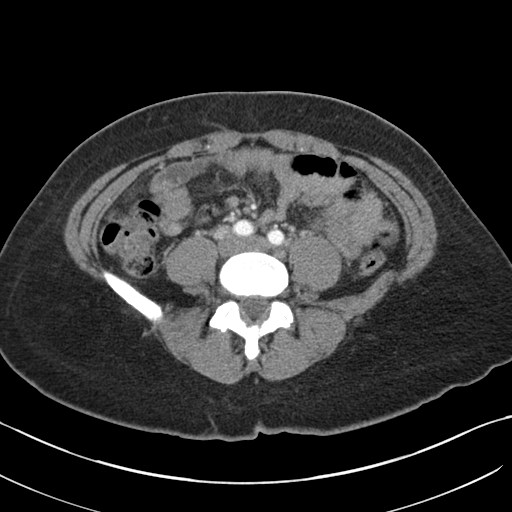
[im 53/95  soft-tissue]
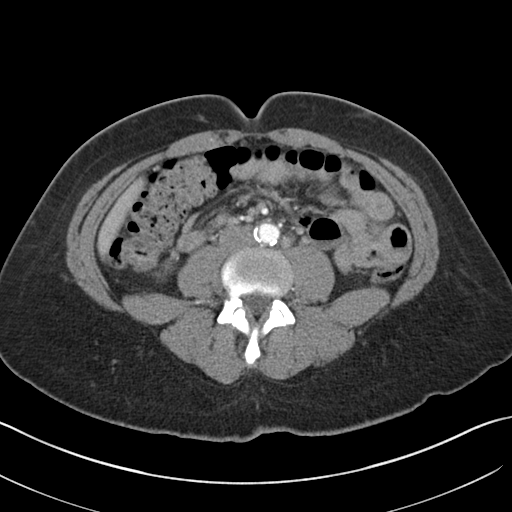
[im 59/95  soft-tissue]
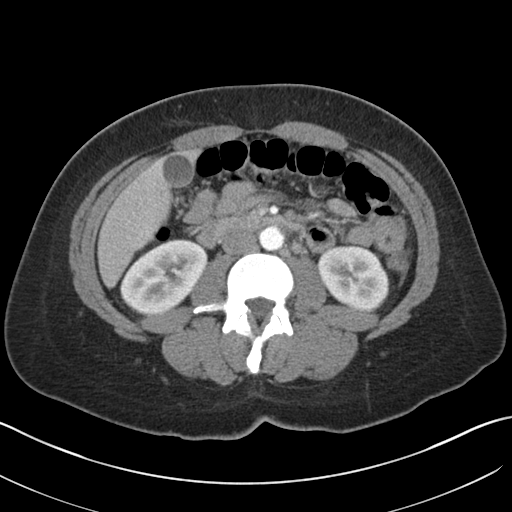
[im 59/95  bone]
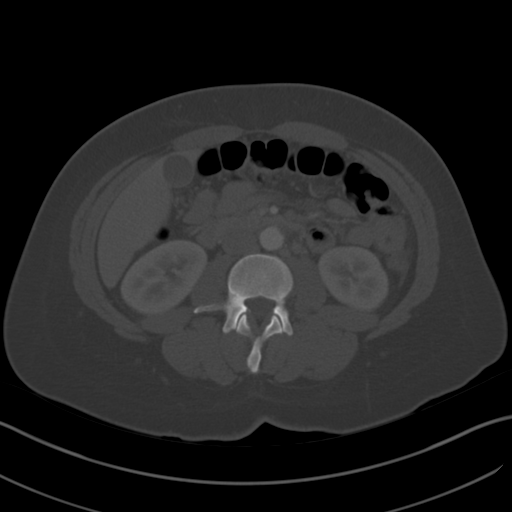
[im 65/95  soft-tissue]
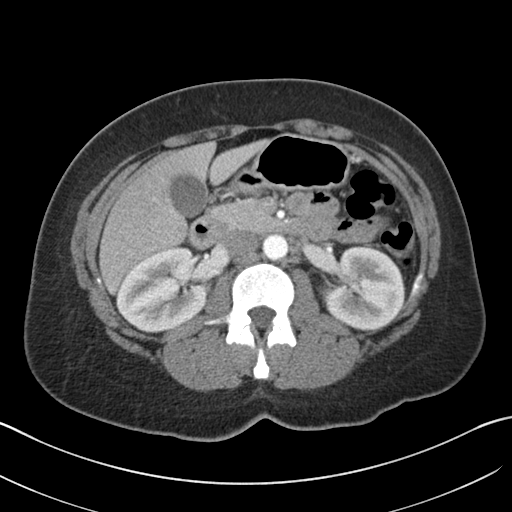
[im 77/95  soft-tissue]
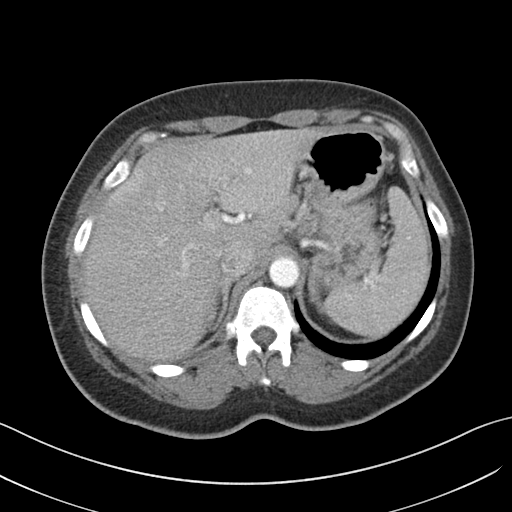
[im 83/95  soft-tissue]
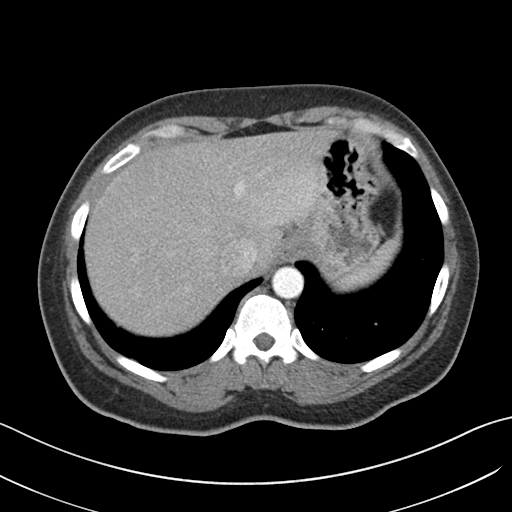
[im 89/95  soft-tissue]
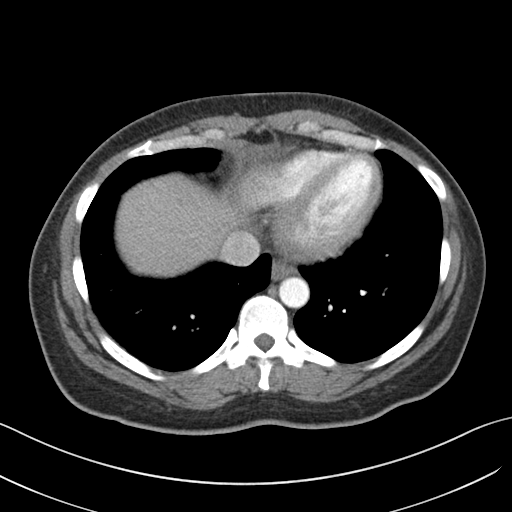

[Series 4: coronal st · coronal · 0.90mm/px · 3 of 126 slices shown]
[im 42/126  soft-tissue]
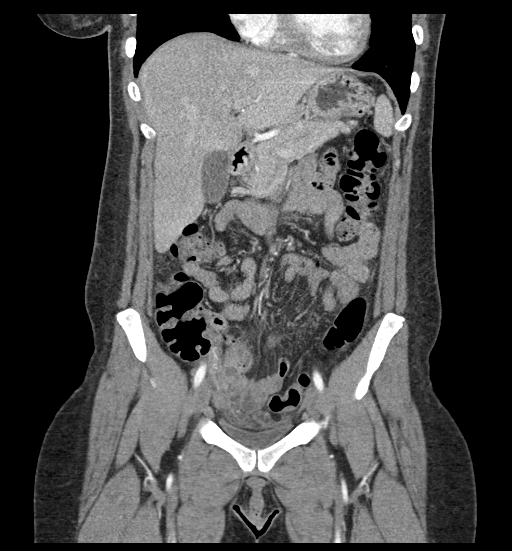
[im 56/126  soft-tissue]
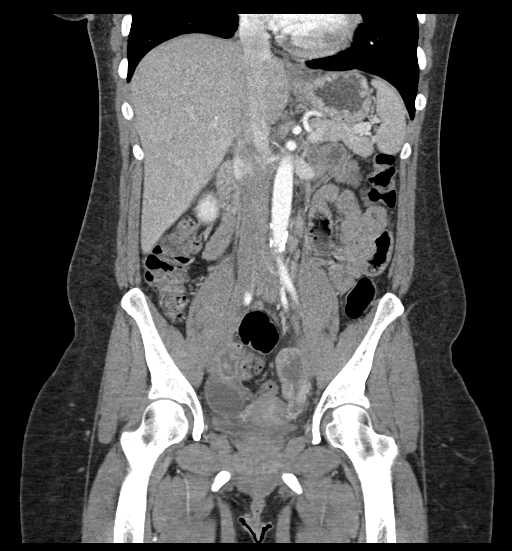
[im 70/126  soft-tissue]
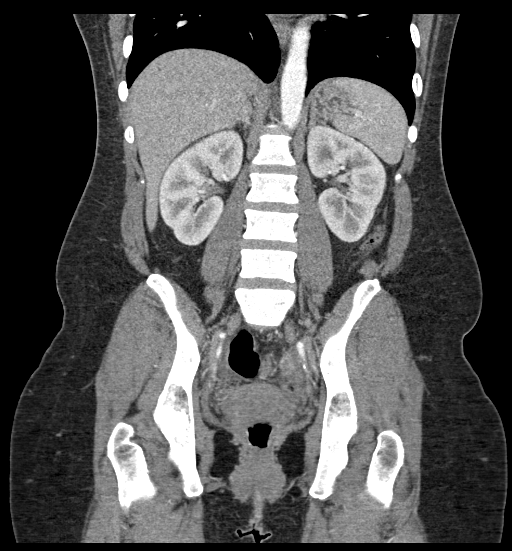

[16 of 46 positions shown; findings below may reference images not displayed]

FINDINGS: Lower chest: Calcified left lower lobe granuloma. Mild centrilobular
emphysema. Normal heart size without pericardial or pleural
effusion.

Hepatobiliary: Hepatomegaly at 20.1 cm craniocaudal. Suspicion of
mild hepatic steatosis. Normal gallbladder, without biliary ductal
dilatation.

Pancreas: Normal, without mass or ductal dilatation.

Spleen: Normal in size, without focal abnormality.

Adrenals/Urinary Tract: Normal adrenal glands. Normal kidneys,
without hydronephrosis. Normal urinary bladder.

Stomach/Bowel: Normal stomach, without wall thickening. Normal
colon, appendix, and terminal ileum. Normal small bowel.

Vascular/Lymphatic: Markedly age advanced aortic atherosclerosis. No
abdominopelvic adenopathy.

Reproductive: Normal uterus. Cylindrical areas of central
hypoattenuation and peripheral enhancement, including cephalad to
the right ovary on 63/2. This is immediately lateral to the
appendix, which appears otherwise normal. There is suggestion of
adnexal edema, most significant on the right.

Other: Small volume pelvic fluid.

Musculoskeletal: No acute osseous abnormality.
IMPRESSION: 1. Pelvic findings which are suspicious for pelvic inflammatory
disease and hydrosalpinx or pyosalpinx. Correlate with risk factors
and consider pelvic ultrasound.
2. Small volume fluid in the cul-de-sac, possibly secondary or
physiologic.
3. No other explanation for pain.  Normal appendix.
4. Aortic Atherosclerosis (JMX6O-[id number].E). This is markedly age
advanced.
5.  Emphysema ([id number]).
6. Hepatomegaly with possible hepatic steatosis.

## 2021-12-19 ENCOUNTER — Other Ambulatory Visit: Payer: Self-pay

## 2021-12-19 ENCOUNTER — Telehealth (HOSPITAL_BASED_OUTPATIENT_CLINIC_OR_DEPARTMENT_OTHER): Payer: Self-pay | Admitting: Emergency Medicine

## 2021-12-19 ENCOUNTER — Encounter (HOSPITAL_BASED_OUTPATIENT_CLINIC_OR_DEPARTMENT_OTHER): Payer: Self-pay | Admitting: *Deleted

## 2021-12-19 ENCOUNTER — Other Ambulatory Visit (HOSPITAL_BASED_OUTPATIENT_CLINIC_OR_DEPARTMENT_OTHER): Payer: Self-pay

## 2021-12-19 ENCOUNTER — Emergency Department (HOSPITAL_BASED_OUTPATIENT_CLINIC_OR_DEPARTMENT_OTHER)
Admission: EM | Admit: 2021-12-19 | Discharge: 2021-12-19 | Disposition: A | Discharge status: Home / Self Care | Payer: BC Managed Care – PPO | Attending: Emergency Medicine | Admitting: Emergency Medicine

## 2021-12-19 DIAGNOSIS — H5789 Other specified disorders of eye and adnexa: Secondary | ICD-10-CM | POA: Diagnosis present

## 2021-12-19 DIAGNOSIS — H1033 Unspecified acute conjunctivitis, bilateral: Secondary | ICD-10-CM | POA: Insufficient documentation

## 2021-12-19 MED ORDER — POLYMYXIN B-TRIMETHOPRIM 10000-0.1 UNIT/ML-% OP SOLN: 1.0000 [drp] | Freq: Four times a day (QID) | OPHTHALMIC | 0 refills | Status: AC

## 2021-12-19 MED ORDER — POLYMYXIN B-TRIMETHOPRIM 10000-0.1 UNIT/ML-% OP SOLN: OPHTHALMIC | 0 refills | Status: AC

## 2021-12-19 MED ORDER — POLYMYXIN B-TRIMETHOPRIM 10000-0.1 UNIT/ML-% OP SOLN
OPHTHALMIC | 0 refills | Status: DC
  Filled 2021-12-19: qty 10, 25d supply, fill #0

## 2021-12-19 MED ORDER — LEVOFLOXACIN 0.5 % OP SOLN: OPHTHALMIC | 0 refills | Status: DC

## 2021-12-19 MED ORDER — POLYMYXIN B-TRIMETHOPRIM 10000-0.1 UNIT/ML-% OP SOLN: 2.0000 [drp] | Freq: Four times a day (QID) | OPHTHALMIC | 0 refills | Status: DC

## 2021-12-19 NOTE — Discharge Instructions (Addendum)
Please pick up eye drops from pharmacy. Please take as prescribed. Avoid touching eyes as much as possible as this can be contagious. Please wash hands frequently. Please return if symptoms do not improve.  ?

## 2021-12-19 NOTE — Telephone Encounter (Signed)
Note created to generate paper Rx for prior Rx. ?

## 2021-12-19 NOTE — ED Provider Notes (Signed)
?[facility] ?Provider Note ? ? ?CSN: [id number] ?Arrival date & time: 12/19/21  1026 ? ?  ? ?History ?PMH: HTN, GERD, IBS ?Chief Complaint  ?Patient presents with  ? Nasal Congestion  ? ? ?Amy Bass is a 36 y.o. female. ?Presents with bilateral red eyes and drainage.  She says she recently had flulike symptoms about 5 days ago that included congestion, sore throat, cough, and fevers.  She says she is gotten better from this illness and symptoms are almost completely resolved, but she noticed bilateral eye irritation for the past 1 and half days.  Drainage has been yellowish and gets stuck in her eyes.  She says she wakes up with her eyes stuck shut.  Does not wear contacts.  The right eye is worse than the left. States that it is itchy.  She does have hx of seasonal allergies, however this is not how her eye irritation usually presents with her allergic conjunctivitis.  ? ?HPI ? ?  ? ?Home Medications ?Prior to Admission medications   ?Medication Sig Start Date End Date Taking? Authorizing Provider  ?levofloxacin Theodoro Clock) 0.5 % ophthalmic solution Place 1 drop into both eyes every 2 (two) hours for 2 days, THEN 1 drop every 6 (six) hours for 5 days. 12/19/21 12/26/21 Yes Giang Hemme, Adora Fridge, PA-C  ?trimethoprim-polymyxin b (POLYTRIM) ophthalmic solution Place 2 drops into both eyes every 6 (six) hours for 6 days. 12/19/21 12/25/21 Yes Akeya Ryther, Adora Fridge, PA-C  ?cephALEXin (KEFLEX) 500 MG capsule Take 1 capsule (500 mg total) by mouth 4 (four) times daily. ?Patient not taking: Reported on 11/12/2019 01/15/16   Gloriann Loan, PA-C  ?ibuprofen (ADVIL,MOTRIN) 600 MG tablet Take 1 tablet (600 mg total) by mouth every 6 (six) hours as needed. ?Patient not taking: Reported on 11/12/2019 01/08/18   Isla Pence, MD  ?   ? ?Allergies    ?Shellfish allergy   ? ?Review of Systems   ?Review of Systems  ?Eyes:  Positive for discharge, redness and itching. Negative for photophobia, pain and visual  disturbance.  ?All other systems reviewed and are negative. ? ?Physical Exam ?Updated Vital Signs ?BP (!) 128/93 (BP Location: Right Arm)   Pulse (!) 104   Temp 98.3 ?F (36.8 ?C) (Oral)   Resp 17   Ht 5\' 3"  (1.6 m)   Wt 81.6 kg   LMP 11/19/2021 (Approximate)   SpO2 100%   BMI 31.89 kg/m?  ?Physical Exam ?Vitals and nursing note reviewed.  ?Constitutional:   ?   General: She is not in acute distress. ?   Appearance: Normal appearance. She is well-developed. She is not ill-appearing, toxic-appearing or diaphoretic.  ?HENT:  ?   Head: Normocephalic and atraumatic.  ?   Right Ear: Tympanic membrane, ear canal and external ear normal. Tympanic membrane is not injected or erythematous.  ?   Left Ear: Tympanic membrane, ear canal and external ear normal. Tympanic membrane is not injected or erythematous.  ?   Nose: No nasal deformity, congestion or rhinorrhea.  ?   Mouth/Throat:  ?   Lips: Pink. No lesions.  ?   Mouth: Mucous membranes are dry. No oral lesions.  ?   Pharynx: Oropharynx is clear. Uvula midline. No pharyngeal swelling, oropharyngeal exudate, posterior oropharyngeal erythema or uvula swelling.  ?   Tonsils: No tonsillar exudate or tonsillar abscesses.  ?Eyes:  ?   General: Gaze aligned appropriately. No scleral icterus.    ?   Right eye: No discharge.     ?  Left eye: No discharge.  ?   Conjunctiva/sclera:  ?   Right eye: Right conjunctiva is injected. Chemosis present. No exudate or hemorrhage. ?   Left eye: Left conjunctiva is injected. No exudate or hemorrhage. ?   Comments: No notable drainage on my exam, slight chemosis of right eye. Mostly conjunctival erythema. No evidence of trauma or corneal involvement.   ?Pulmonary:  ?   Effort: Pulmonary effort is normal. No respiratory distress.  ?Skin: ?   General: Skin is warm and dry.  ?Neurological:  ?   Mental Status: She is alert and oriented to person, place, and time.  ?Psychiatric:     ?   Mood and Affect: Mood normal.     ?   Speech: Speech  normal.     ?   Behavior: Behavior normal. Behavior is cooperative.  ? ? ?ED Results / Procedures / Treatments   ?Labs ?(all labs ordered are listed, but only abnormal results are displayed) ?Labs Reviewed - No data to display ? ?EKG ?None ? ?Radiology ?No results found. ? ?Procedures ?Procedures  ? ?Medications Ordered in ED ?Medications - No data to display ? ?ED Course/ Medical Decision Making/ A&P ?  ?                        ?Medical Decision Making ?Risk ?Prescription drug management. ? ? ? ?MDM  ?This is a 36 y.o. female who presents to the ED with bilateral red eyes and drainage ?The differential of this patient includes but is not limited to bacterial conjunctivitis, viral conjunctivitis, allergic conjunctivitis ? ?My Impression, Plan, and ED Course: Patient presents with bilateral conjunctival erythema with white/milky drainage in the setting of a recent viral URI.  Based on patient's symptoms, this is more consistent with a bacterial cause with the purulent drainage, however with recent URI, this could be viral.  Does not really seem consistent with allergic conjunctivitis.  Do not feel that she would benefit from a fluorescein dye exam as there is no evidence of trauma, or any consideration for corneal injury.  No visual loss associated, so unlikely to be more serious ophthalmic pathology. We will go ahead and treat for bacterial conjunctivitis. Return precautions provided.  ? ?Charting Requirements ?Additional history is obtained from:  Independent historian ?External Records from outside source obtained and reviewed including: Last recorded visit was in 2021 for suspected COVID-19  ?Social Determinants of Health:  Access to medical care ?Pertinant PMH that complicates patient's illness: n/a ? ?Patient Care ?Problems that were addressed during this visit: ?- Bilateral Conjunctivitis: Acute illness ?Disposition: ophthalmic antibiotics, return precautions ? ?Portions of this note were generated with Geographical information systems officer. Dictation errors may occur despite best attempts at proofreading. ?  ? ?Final Clinical Impression(s) / ED Diagnoses ?Final diagnoses:  ?Acute conjunctivitis of both eyes, unspecified acute conjunctivitis type  ? ? ?Rx / DC Orders ?ED Discharge Orders   ? ?      Ordered  ?  levofloxacin (QUIXIN) 0.5 % ophthalmic solution       ? 12/19/21 1114  ?  trimethoprim-polymyxin b (POLYTRIM) ophthalmic solution  Every 6 hours       ? 12/19/21 1115  ? ?  ?  ? ?  ? ? ?  ?Adolphus Birchwood, PA-C ?12/19/21 1125 ? ?  ?Blanchie Dessert, MD ?12/19/21 1440 ? ?

## 2021-12-19 NOTE — ED Triage Notes (Incomplete)
Tuesday onset congestion, ha, felt like had a fever, cough, woke this am w eye irration and crusty  ?

## 2021-12-22 ENCOUNTER — Other Ambulatory Visit (HOSPITAL_BASED_OUTPATIENT_CLINIC_OR_DEPARTMENT_OTHER): Payer: Self-pay
